# Patient Record
Sex: Male | Born: 1940 | Race: White | Hispanic: No | Marital: Married
Health system: Southern US, Community
[De-identification: ages and names within clinical notes are randomized; demographics above are authoritative.]

## PROBLEM LIST (undated history)

## (undated) DIAGNOSIS — R251 Tremor, unspecified: Secondary | ICD-10-CM

## (undated) DIAGNOSIS — L0390 Cellulitis, unspecified: Secondary | ICD-10-CM

## (undated) DIAGNOSIS — J449 Chronic obstructive pulmonary disease, unspecified: Secondary | ICD-10-CM

## (undated) DIAGNOSIS — F0390 Unspecified dementia without behavioral disturbance: Secondary | ICD-10-CM

## (undated) DIAGNOSIS — N400 Enlarged prostate without lower urinary tract symptoms: Secondary | ICD-10-CM

---

## 2015-04-01 ENCOUNTER — Encounter: Payer: Medicare Other | Attending: Surgery | Admitting: Surgery

## 2015-04-01 DIAGNOSIS — I890 Lymphedema, not elsewhere classified: Secondary | ICD-10-CM | POA: Insufficient documentation

## 2015-04-01 DIAGNOSIS — G309 Alzheimer's disease, unspecified: Secondary | ICD-10-CM | POA: Insufficient documentation

## 2015-04-01 DIAGNOSIS — Z880 Allergy status to penicillin: Secondary | ICD-10-CM | POA: Diagnosis not present

## 2015-04-01 DIAGNOSIS — Z87891 Personal history of nicotine dependence: Secondary | ICD-10-CM | POA: Diagnosis not present

## 2015-04-01 DIAGNOSIS — L97221 Non-pressure chronic ulcer of left calf limited to breakdown of skin: Secondary | ICD-10-CM | POA: Diagnosis present

## 2015-04-01 DIAGNOSIS — L97211 Non-pressure chronic ulcer of right calf limited to breakdown of skin: Secondary | ICD-10-CM | POA: Diagnosis not present

## 2015-04-02 NOTE — Progress Notes (Signed)
TYMAR, POLYAK ([id number]) Visit Report for 04/01/2015 Chief Complaint Document Details Patient Name: [name], [name] 04/01/2015 10:15 Date of Service: AM Medical Record [id number] Number: Patient Account Number: [id number] Date of Birth/Sex: Nov 28, 1940 (75 y.o. Male) Treating RN: Curtis Sites Primary Care Other Clinician: Physician: Treating Eshan Trupiano, Threasa Alpha, MARY Physician/Extender: Referring Physician: Derald Macleod in Treatment: 0 Information Obtained from: Caregiver Chief Complaint Compression wrap change due to swelling of both lower extremities for about a year Electronic Signature(s) Signed: 04/01/2015 12:01:01 PM By: Evlyn Kanner MD, FACS Entered By: Evlyn Kanner on 04/01/2015 12:01:00 TAKOTA, CAHALAN ([id number]) -------------------------------------------------------------------------------- HPI Details Patient Name: [name], [name] 04/01/2015 10:15 Date of Service: AM Medical Record [id number] Number: Patient Account Number: [id number] Date of Birth/Sex: 1940-12-22 (75 y.o. Male) Treating RN: Curtis Sites Primary Care Other Clinician: Physician: Treating Maxtyn Nuzum, Threasa Alpha, MARY Physician/Extender: Referring Physician: Derald Macleod in Treatment: 0 History of Present Illness Location: both lower extremities swollen and having some fluid draining Quality: Patient reports No Pain. Severity: Patient states wound are getting worse. Duration: Patient has had the wound for > 3 months prior to seeking treatment at the wound center Context: The wound appeared gradually over time Modifying Factors: Other treatment(s) tried include:he is at a couple of courses of antibiotics and some wraps applied to his legs Associated Signs and Symptoms: Patient reports having increase swelling. 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 He was generally  very healthy until about 4 years ago when the dementia set in and now has been at the [facility]. No formal workup has been done but they tried to wrap his legs which sometimes the patients takes down and has had a couple of courses of antibiotics. Not have a definite history of diabetes and was not a smoker Psychologist, prison and probation services) Signed: 04/01/2015 12:02:39 PM By: Evlyn Kanner MD, FACS Entered By: Evlyn Kanner on 04/01/2015 12:02:39 RAHKIM, RABALAIS ([id number]) -------------------------------------------------------------------------------- Physical Exam Details Patient Name: [name], [name] 04/01/2015 10:15 Date of Service: AM Medical Record [id number] Number: Patient Account Number: [id number] Date of Birth/Sex: Feb 04, 1941 (75 y.o. Male) Treating RN: Curtis Sites Primary Care Other Clinician: Physician: Treating Jakyiah Briones, Threasa Alpha, MARY Physician/Extender: Referring Physician: Derald Macleod in Treatment: 0 Constitutional . Pulse regular. Respirations normal and unlabored. Afebrile. . Eyes Nonicteric. Reactive to light. Ears, Nose, Mouth, and Throat Lips, teeth, and gums WNL.Marland Kitchen Moist mucosa without lesions. Neck supple and nontender. No palpable supraclavicular or cervical adenopathy. Normal sized without goiter. Respiratory WNL. No retractions.. Cardiovascular Pedal Pulses WNL. ABI on the left was 1.26 on the right was 1.35. No clubbing, cyanosis or edema. Gastrointestinal (GI) Abdomen without masses or tenderness.. No liver or spleen enlargement or tenderness.. Lymphatic No adneopathy. No adenopathy. No adenopathy. Musculoskeletal Adexa without tenderness or enlargement.. Digits and nails w/o clubbing, cyanosis, infection, petechiae, ischemia, or inflammatory conditions.. Integumentary (Hair, Skin) No suspicious lesions. No crepitus or fluctuance. No peri-wound warmth or erythema. No masses.Marland Kitchen Psychiatric Judgement and insight Intact.. No evidence  of depression, anxiety, or agitation.. Notes he has bilateral lymphedema and there are micro-ulcerations with some fluid draining out of this. You also have an element of fungal inflammation. Electronic Signature(s) Signed: 04/01/2015 12:03:32 PM By: Evlyn Kanner MD, FACS Entered By: Evlyn Kanner on 04/01/2015 12:03:31 EASTON, FETTY ([id number]) -------------------------------------------------------------------------------- Physician Orders Details Patient Name: TRANELL, WOJTKIEWICZ 04/01/2015 10:15 Date of Service: AM Medical Record [id number] Number: Patient Account Number: [id number] Date  of Birth/Sex: 12-Feb-1941 (75 y.o. Male) Treating RN: Curtis Sites Primary Care Other Clinician: Physician: Treating Kaipo Ardis, Threasa Alpha, MARY Physician/Extender: Referring Physician: Derald Macleod in Treatment: 0 Verbal / Phone Orders: Yes Clinician: Curtis Sites Read Back and Verified: Yes Diagnosis Coding Wound Cleansing Wound #1 Left,Lateral Lower Leg o Cleanse wound with mild soap and water Wound #2 Right,Lateral Lower Leg o Cleanse wound with mild soap and water Anesthetic Wound #1 Left,Lateral Lower Leg o Topical Lidocaine 4% cream applied to wound bed prior to debridement Wound #2 Right,Lateral Lower Leg o Topical Lidocaine 4% cream applied to wound bed prior to debridement Primary Wound Dressing Wound #1 Left,Lateral Lower Leg o Aquacel Ag o Other: - nystatin or lotrisone cream to red areas on legs Wound #2 Right,Lateral Lower Leg o Aquacel Ag o Other: - nystatin or lotrisone cream to red areas on legs Secondary Dressing Wound #1 Left,Lateral Lower Leg o ABD pad Wound #2 Right,Lateral Lower Leg o ABD pad Dressing Change Frequency Wound #1 Left,Lateral Lower Leg o Change Dressing Monday, Wednesday, Friday HOYT, LEANOS ([id number]) Wound #2 Right,Lateral Lower Leg o Change Dressing Monday, Wednesday, Friday Follow-up Appointments Wound #1  Left,Lateral Lower Leg o Return Appointment in 1 week. Wound #2 Right,Lateral Lower Leg o Return Appointment in 1 week. Edema Control Wound #1 Left,Lateral Lower Leg o 2 Layer Compression System applied bilaterally - kerlix and coban Wound #2 Right,Lateral Lower Leg o 2 Layer Compression System applied bilaterally - kerlix and coban Home Health Wound #1 Left,Lateral Lower Leg o Continue Home Health Visits o Home Health Nurse may visit PRN to address patientos wound care needs. o FACE TO FACE ENCOUNTER: MEDICARE and MEDICAID PATIENTS: I certify that this patient is under my care and that I had a face-to-face encounter that meets the physician face-to-face encounter requirements with this patient on this date. The encounter with the patient was in whole or in part for the following MEDICAL CONDITION: (primary reason for Home Healthcare) MEDICAL NECESSITY: I certify, that based on my findings, NURSING services are a medically necessary home health service. HOME BOUND STATUS: I certify that my clinical findings support that this patient is homebound (i.e., Due to illness or injury, pt requires aid of supportive devices such as crutches, cane, wheelchairs, walkers, the use of special transportation or the assistance of another person to leave their place of residence. There is a normal inability to leave the home and doing so requires considerable and taxing effort. Other absences are for medical reasons / religious services and are infrequent or of short duration when for other reasons). o If current dressing causes regression in wound condition, may D/C ordered dressing product/s and apply Normal Saline Moist Dressing daily until next Wound Healing Center / Other MD appointment. Notify Wound Healing Center of regression in wound condition at [phone]. o Please direct any NON-WOUND related issues/requests for orders to patient's Primary Care Physician Wound #2  Right,Lateral Lower Leg o Continue Home Health Visits o Home Health Nurse may visit PRN to address patientos wound care needs. o FACE TO FACE ENCOUNTER: MEDICARE and MEDICAID PATIENTS: I certify that this patient is under my care and that I had a face-to-face encounter that meets the physician face-to-face encounter requirements with this patient on this date. The encounter with the patient was in whole or in part for the following MEDICAL CONDITION: (primary reason for Home Healthcare) MEDICAL NECESSITY: I certify, that based on my findings, NURSING services are a medically ALDRED, MASE ([id number]) necessary home  health service. HOME BOUND STATUS: I certify that my clinical findings support that this patient is homebound (i.e., Due to illness or injury, pt requires aid of supportive devices such as crutches, cane, wheelchairs, walkers, the use of special transportation or the assistance of another person to leave their place of residence. There is a normal inability to leave the home and doing so requires considerable and taxing effort. Other absences are for medical reasons / religious services and are infrequent or of short duration when for other reasons). o If current dressing causes regression in wound condition, may D/C ordered dressing product/s and apply Normal Saline Moist Dressing daily until next Wound Healing Center / Other MD appointment. Notify Wound Healing Center of regression in wound condition at [phone]. o Please direct any NON-WOUND related issues/requests for orders to patient's Primary Care Physician Medications-please add to medication list. Wound #1 Left,Lateral Lower Leg o Other: - lortisone cream Wound #2 Right,Lateral Lower Leg o Other: - lortisone cream Electronic Signature(s) Signed: 04/01/2015 3:57:16 PM By: Evlyn Kanner MD, FACS Signed: 04/01/2015 5:06:47 PM By: Curtis Sites Entered By: Curtis Sites on 04/01/2015 11:57:26 [name] ([id number]) -------------------------------------------------------------------------------- Problem List Details Patient Name: [name], [name] 04/01/2015 10:15 Date of Service: AM Medical Record [id number] Number: Patient Account Number: [id number] Date of Birth/Sex: 1941/01/25 (75 y.o. Male) Treating RN: Curtis Sites Primary Care Other Clinician: Physician: Treating Dameer Speiser, Threasa Alpha, MARY Physician/Extender: Referring Physician: Derald Macleod in Treatment: 0 Active Problems ICD-10 Encounter Code Description Active Date Diagnosis I89.0 Lymphedema, not elsewhere classified 04/01/2015 Yes L97.221 Non-pressure chronic ulcer of left calf limited to 04/01/2015 Yes breakdown of skin L97.211 Non-pressure chronic ulcer of right calf limited to 04/01/2015 Yes breakdown of skin G30.9 Alzheimer's disease, unspecified 04/01/2015 Yes Inactive Problems Resolved Problems Electronic Signature(s) Signed: 04/01/2015 12:00:29 PM By: Evlyn Kanner MD, FACS Previous Signature: 04/01/2015 12:00:20 PM Version By: Evlyn Kanner MD, FACS Entered By: Evlyn Kanner on 04/01/2015 12:00:28 BOWEN, KIA ([id number]) -------------------------------------------------------------------------------- Progress Note Details Patient Name: OMID, DEARDORFF 04/01/2015 10:15 Date of Service: AM Medical Record [id number] Number: Patient Account Number: [id number] Date of Birth/Sex: May 29, 1940 (75 y.o. Male) Treating RN: Curtis Sites Primary Care Other Clinician: Physician: Treating Jamilynn Whitacre, Threasa Alpha, MARY Physician/Extender: Referring Physician: Derald Macleod in Treatment: 0 Subjective Chief Complaint Information obtained from Caregiver Compression wrap change due to swelling of both lower extremities for about a year History of Present Illness (HPI) The following HPI elements were documented for the patient's wound: Location: both lower extremities swollen and having some fluid draining Quality:  Patient reports No Pain. Severity: Patient states wound are getting worse. Duration: Patient has had the wound for > 3 months prior to seeking treatment at the wound center Context: The wound appeared gradually over time Modifying Factors: Other treatment(s) tried include:he is at a couple of courses of antibiotics and some wraps applied to his legs Associated Signs and Symptoms: Patient reports having increase swelling. 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 He was generally very healthy until about 4 years ago when the dementia set in and now has been at the [facility]. No formal workup has been done but they tried to wrap his legs which sometimes the patients takes down and has had a couple of courses of antibiotics. Not have a definite history of diabetes and was not a smoker Wound History Patient reportedly has not tested positive for  osteomyelitis. Patient reportedly has not had testing performed to evaluate circulation in the legs. Patient experiences the following problems associated with their wounds: swelling. Patient History Information obtained from Caregiver. Allergies penicillin TERRIUS, GENTILE ([id number]) Social History Former smoker - as a young adult, Marital Status - Married, Alcohol Use - Never, Drug Use - No History, Caffeine Use - Never. Medical History Eyes Patient has history of Cataracts - removed Neurologic Patient has history of Dementia - alzheimers Oncologic Denies history of Received Chemotherapy, Received Radiation Medical And Surgical History Notes Neurologic tremors Psychiatric psychosis Review of Systems (ROS) Constitutional Symptoms (General Health) The patient has no complaints or symptoms. Eyes The patient has no complaints or symptoms. Ear/Nose/Mouth/Throat The patient has no complaints or  symptoms. Hematologic/Lymphatic The patient has no complaints or symptoms. Respiratory The patient has no complaints or symptoms. Cardiovascular Complains or has symptoms of LE edema. Gastrointestinal The patient has no complaints or symptoms. Endocrine The patient has no complaints or symptoms. Genitourinary Complains or has symptoms of Incontinence/dribbling. Immunological The patient has no complaints or symptoms. Integumentary (Skin) The patient has no complaints or symptoms. Musculoskeletal The patient has no complaints or symptoms. Oncologic The patient has no complaints or symptoms. Medications JEFTE, CARITHERS ([id number]) aspirin 81 mg chewable tablet oral 1 1 tablet,chewable oral carbamazepine 200 mg tablet oral tablet oral primidone 50 mg tablet oral 1 1 tablet oral loperamide 2 mg capsule oral 2 2 capsule oral risperidone 2 mg tablet oral 1 1 tablet oral Flovent HFA 110 mcg/actuation aerosol inhaler inhalation 1 1 HFA aerosol inhaler inhalation polyethylene glycol 3350 17 gram oral powder packet oral 1 1 powder in packet oral furosemide 20 mg tablet oral 1 1 tablet oral multivitamin tablet oral 1 1 tablet oral fluticasone 50 mcg/actuation nasal spray,suspension nasal 1 1 spray,suspension nasal potassium chloride ER 10 mEq tablet,extended release(part/cryst) oral 1 1 tablet,ER particles/crystals oral zolpidem 5 mg tablet oral 1 1 tablet oral paroxetine 20 mg tablet oral 1 1 tablet oral Vitamin D3 1,000 unit tablet oral 1 1 tablet oral Objective Constitutional Pulse regular. Respirations normal and unlabored. Afebrile. Vitals Time Taken: 10:59 AM, Height: 69 in, Source: Stated, Weight: 203 lbs, Source: Stated, BMI: 30, Temperature: 97.7 F, Pulse: 64 bpm, Respiratory Rate: 16 breaths/min, Blood Pressure: 134/72 mmHg. Eyes Nonicteric. Reactive to light. Ears, Nose, Mouth, and Throat Lips, teeth, and gums WNL.Marland Kitchen Moist mucosa without lesions. Neck supple and  nontender. No palpable supraclavicular or cervical adenopathy. Normal sized without goiter. Respiratory WNL. No retractions.. Cardiovascular Pedal Pulses WNL. ABI on the left was 1.26 on the right was 1.35. No clubbing, cyanosis or edema. Gastrointestinal (GI) Abdomen without masses or tenderness.. No liver or spleen enlargement or tenderness.. Lymphatic No adneopathy. No adenopathy. No adenopathy. Musculoskeletal AARSH, FRISTOE ([id number]) Adexa without tenderness or enlargement.. Digits and nails w/o clubbing, cyanosis, infection, petechiae, ischemia, or inflammatory conditions.Marland Kitchen Psychiatric Judgement and insight Intact.. No evidence of depression, anxiety, or agitation.. General Notes: he has bilateral lymphedema and there are micro-ulcerations with some fluid draining out of this. You also have an element of fungal inflammation. Integumentary (Hair, Skin) No suspicious lesions. No crepitus or fluctuance. No peri-wound warmth or erythema. No masses.. Wound #1 status is Open. Original cause of wound was Gradually Appeared. The wound is located on the Left,Lateral Lower Leg. The wound measures 2.2cm length x 0.8cm width x 0.1cm depth; 1.382cm^2 area and 0.138cm^3 volume. The wound is limited to skin breakdown. There is no tunneling or undermining noted. There is a medium  amount of serous drainage noted. The wound margin is flat and intact. There is large (67-100%) pink granulation within the wound bed. There is no necrotic tissue within the wound bed. The periwound skin appearance did not exhibit: Callus, Crepitus, Excoriation, Fluctuance, Friable, Induration, Localized Edema, Rash, Scarring, Dry/Scaly, Maceration, Moist, Atrophie Blanche, Cyanosis, Ecchymosis, Hemosiderin Staining, Mottled, Pallor, Rubor, Erythema. Periwound temperature was noted as No Abnormality. Wound #2 status is Open. Original cause of wound was Gradually Appeared. The wound is located on the Right,Lateral Lower  Leg. The wound measures 4cm length x 5cm width x 0.1cm depth; 15.708cm^2 area and 1.571cm^3 volume. The wound is limited to skin breakdown. There is no tunneling or undermining noted. There is a large amount of serous drainage noted. The wound margin is flat and intact. There is large (67-100%) pink granulation within the wound bed. There is no necrotic tissue within the wound bed. The periwound skin appearance did not exhibit: Callus, Crepitus, Excoriation, Fluctuance, Friable, Induration, Localized Edema, Rash, Scarring, Dry/Scaly, Maceration, Moist, Atrophie Blanche, Cyanosis, Ecchymosis, Hemosiderin Staining, Mottled, Pallor, Rubor, Erythema. Periwound temperature was noted as No Abnormality. 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 I agree with her and would recommend: 1. No formal workup of his arterial and venous systems. 2. Aquacel Ag over the ulcerations which are small, antifungal ointment over the skin and a light Kerlix and Coban wrap to start with. This can be changed by his nurses 3 times a week. 3. the him at regular intervals at the wound center and make appropriate changes to his compression. 4. once the heel put him in compression stockings appropriately. The wife has had all questions answered and will be back with them next week Plan Wound Cleansing: Wound #1 Left,Lateral Lower Leg: Cleanse wound with  mild soap and water Wound #2 Right,Lateral Lower Leg: Cleanse wound with mild soap and water Anesthetic: Wound #1 Left,Lateral Lower Leg: Topical Lidocaine 4% cream applied to wound bed prior to debridement Wound #2 Right,Lateral Lower Leg: Topical Lidocaine 4% cream applied to wound bed prior to debridement Primary Wound Dressing: Wound #1 Left,Lateral Lower Leg: Aquacel Ag Other: - nystatin or lotrisone cream to red areas on legs Wound #2 Right,Lateral Lower Leg: Aquacel Ag Other: - nystatin or lotrisone cream to red areas on legs Secondary Dressing: Wound #1 Left,Lateral Lower Leg: ABD pad Wound #2 Right,Lateral Lower Leg: ABD pad Dressing Change Frequency: Wound #1 Left,Lateral Lower Leg: Change Dressing Monday, Wednesday, Friday Wound #2 Right,Lateral Lower Leg: Change Dressing Monday, Wednesday, Friday Follow-up Appointments: Wound #1 Left,Lateral Lower Leg: Return Appointment in 1 week. Wound #2 Right,Lateral Lower Leg: Return Appointment in 1 week. Edema Control: OLANDO, WILLEMS ([id number]) Wound #1 Left,Lateral Lower Leg: 2 Layer Compression System applied bilaterally - kerlix and coban Wound #2 Right,Lateral Lower Leg: 2 Layer Compression System applied bilaterally - kerlix and coban Home Health: Wound #1 Left,Lateral Lower Leg: Continue Home Health Visits Home Health Nurse may visit PRN to address patient s wound care needs. FACE  TO FACE ENCOUNTER: MEDICARE and MEDICAID PATIENTS: I certify that this patient is under my care and that I had a face-to-face encounter that meets the physician face-to-face encounter requirements with this patient on this date. The encounter with the patient was in whole or in part for the following MEDICAL CONDITION: (primary reason for Home Healthcare) MEDICAL NECESSITY: I certify, that based on my findings, NURSING services are a medically necessary home health service. HOME BOUND STATUS: I certify that my clinical findings support  that this patient is homebound (i.e., Due to illness or injury, pt requires aid of supportive devices such as crutches, cane, wheelchairs, walkers, the use of special transportation or the assistance of another person to leave their place of residence. There is a normal inability to leave the home and doing so requires considerable and taxing effort. Other absences are for medical reasons / religious services and are infrequent or of short duration when for other reasons). If current dressing causes regression in wound condition, may D/C ordered dressing product/s and apply Normal Saline Moist Dressing daily until next Wound Healing Center / Other MD appointment. Notify Wound Healing Center of regression in wound condition at [phone]. Please direct any NON-WOUND related issues/requests for orders to patient's Primary Care Physician Wound #2 Right,Lateral Lower Leg: Continue Home Health Visits Home Health Nurse may visit PRN to address patient s wound care needs. FACE TO FACE ENCOUNTER: MEDICARE and MEDICAID PATIENTS: I certify that this patient is under my care and that I had a face-to-face encounter that meets the physician face-to-face encounter requirements with this patient on this date. The encounter with the patient was in whole or in part for the following MEDICAL CONDITION: (primary reason for Home Healthcare) MEDICAL NECESSITY: I certify, that based on my findings, NURSING services are a medically necessary home health service. HOME BOUND STATUS: I certify that my clinical findings support that this patient is homebound (i.e., Due to illness or injury, pt requires aid of supportive devices such as crutches, cane, wheelchairs, walkers, the use of special transportation or the assistance of another person to leave their place of residence. There is a normal inability to leave the home and doing so requires considerable and taxing effort. Other absences are for medical reasons /  religious services and are infrequent or of short duration when for other reasons). If current dressing causes regression in wound condition, may D/C ordered dressing product/s and apply Normal Saline Moist Dressing daily until next Wound Healing Center / Other MD appointment. Notify Wound Healing Center of regression in wound condition at [phone]. Please direct any NON-WOUND related issues/requests for orders to patient's Primary Care Physician Medications-please add to medication list.: Wound #1 Left,Lateral Lower Leg: Other: - lortisone cream Wound #2 Right,Lateral Lower Leg: Other: - lortisone cream [name], [name] ([id number]) This 75 year old gentleman with significant dementia is seen with bilateral lower extremity lymphedema stage II which as per his wife's request will be treated symptomatically and she does not want to much of investigations done due to the inability of him to be transported and putting him through discomfort. I agree with her and would recommend: 1. No formal workup of his arterial and venous systems. 2. Aquacel Ag over the ulcerations which are small, antifungal ointment over the skin and a light Kerlix and Coban wrap to start with. This can be changed by his nurses 3 times a week. 3. the him at regular intervals at the wound center and make appropriate changes to his compression. 4.  once the heel put him in compression stockings appropriately. The wife has had all questions answered and will be back with them next week Electronic Signature(s) Signed: 04/01/2015 12:08:34 PM By: Evlyn Kanner MD, FACS Entered By: Evlyn Kanner on 04/01/2015 12:08:34 [name] ([id number]) -------------------------------------------------------------------------------- ROS/PFSH Details Patient Name: [name], [name] 04/01/2015 10:15 Date of Service: AM Medical Record [id number] Number: Patient Account Number: [id number] Date of Birth/Sex: May 03, 1940 (75 y.o. Male) Treating RN:  Curtis Sites Primary Care Other Clinician: Physician: Treating Ayub Kirsh, Threasa Alpha, MARY Physician/Extender: Referring Physician: Derald Macleod in Treatment: 0 Information Obtained From Caregiver Wound History Do you currently have one or more open woundso Yes Approximately how long have you had your woundso 3 months How have you been treating your wound(s) until nowo silver alginate and compression hose Has your wound(s) ever healed and then re-openedo No Have you had any lab work done in the past montho No Have you tested positive for an antibiotic resistant organism No (MRSA, VRE)o Have you tested positive for osteomyelitis (bone infection)o No Have you had any tests for circulation on your legso No Have you had other problems associated with your woundso Swelling Cardiovascular Complaints and Symptoms: Positive for: LE edema Genitourinary Complaints and Symptoms: Positive for: Incontinence/dribbling Constitutional Symptoms (General Health) Complaints and Symptoms: No Complaints or Symptoms Eyes Complaints and Symptoms: No Complaints or Symptoms Medical History: Positive for: Cataracts - removed Ear/Nose/Mouth/Throat CARRIE, SCHOONMAKER ([id number]) Complaints and Symptoms: No Complaints or Symptoms Hematologic/Lymphatic Complaints and Symptoms: No Complaints or Symptoms Respiratory Complaints and Symptoms: No Complaints or Symptoms Gastrointestinal Complaints and Symptoms: No Complaints or Symptoms Endocrine Complaints and Symptoms: No Complaints or Symptoms Immunological Complaints and Symptoms: No Complaints or Symptoms Integumentary (Skin) Complaints and Symptoms: No Complaints or Symptoms Musculoskeletal Complaints and Symptoms: No Complaints or Symptoms Neurologic Medical History: Positive for: Dementia - alzheimers Past Medical History Notes: tremors Oncologic Complaints and Symptoms: No Complaints or Symptoms Medical History: Negative for:  Received Chemotherapy; Received Radiation [name], [name] ([id number]) Psychiatric Medical History: Past Medical History Notes: psychosis HBO Extended History Items Eyes: Cataracts Immunizations Immunization Notes: up to date Family and Social History Former smoker - as a young adult; Marital Status - Married; Alcohol Use: Never; Drug Use: No History; Caffeine Use: Never; Financial Concerns: No; Food, Clothing or Shelter Needs: No; Support System Lacking: No; Transportation Concerns: No; Advanced Directives: Yes - spouse is guardian (Not Provided); Patient does not want information on Advanced Directives; Medical Power of Attorney: No Physician Affirmation I have reviewed and agree with the above information. Electronic Signature(s) Signed: 04/01/2015 11:46:28 AM By: Evlyn Kanner MD, FACS Signed: 04/01/2015 5:06:47 PM By: Curtis Sites Entered By: Evlyn Kanner on 04/01/2015 11:46:28 [name], [name] ([id number]) -------------------------------------------------------------------------------- SuperBill Details Patient Name: [name] Date of Service: 04/01/2015 Medical Record Patient Account Number: [id number] Number: Dorthy, Treating RN: Date of Birth/Sex: Mar 09, 1940 (75 y.o. 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 G30.9 Alzheimer's disease, unspecified Facility Procedures CPT4 Code: [id number] Description: 25366 - WOUND CARE VISIT-LEV 5 EST PT Modifier: Quantity: 1 Physician Procedures CPT4 Code Description: 4403474 25956 - WC PHYS LEVEL 4 - NEW PT ICD-10 Description Diagnosis I89.0 Lymphedema, not  elsewhere classified L97.221 Non-pressure chronic ulcer of left calf limited to  b L97.211 Non-pressure chronic ulcer of right calf limited  to G30.9 Alzheimer's disease, unspecified Modifier: reakdown of skin breakdown of ski Quantity: 1 n Electronic Signature(s) Signed: 04/01/2015 4:42:57 PM By: Curtis Sites Previous Signature: 04/01/2015 12:08:52 PM Version By: Evlyn Kanner MD, FACS Entered By: Curtis Sites on 04/01/2015 16:42:57

## 2015-04-02 NOTE — Progress Notes (Signed)
GAL, FELDHAUS ([id number]) Visit Report for 04/01/2015 Allergy List Details Patient Name: [name], [name] 04/01/2015 10:15 Date of Service: AM Medical Record [id number] Number: Patient Account Number: [id number] Date of Birth/Sex: 1941/01/22 (75 y.o. Male) Treating RN: Curtis Sites Primary Care Other Clinician: Physician: Treating Britto, Threasa Alpha, MARY Physician/Extender: Referring Physician: Derald Macleod in Treatment: 0 Allergies Active Allergies penicillin Allergy Notes Electronic Signature(s) Signed: 04/01/2015 5:06:47 PM By: Curtis Sites Entered By: Curtis Sites on 04/01/2015 11:03:00 [name] ([id number]) -------------------------------------------------------------------------------- Arrival Information Details Patient Name: [name] 04/01/2015 10:15 Date of Service: AM Medical Record [id number] Number: Patient Account Number: [id number] Date of Birth/Sex: 11-23-1940 (75 y.o. Male) Treating RN: Curtis Sites Primary Care Other Clinician: Physician: Treating Britto, Threasa Alpha, MARY Physician/Extender: Referring Physician: Derald Macleod in Treatment: 0 Visit Information Patient Arrived: Ambulatory Arrival Time: 10:55 Accompanied By: wife Transfer Assistance: None Patient Identification Verified: Yes Secondary Verification Process Yes Completed: Electronic Signature(s) Signed: 04/01/2015 5:06:47 PM By: Curtis Sites Entered By: Curtis Sites on 04/01/2015 10:59:25 [name] ([id number]) -------------------------------------------------------------------------------- Clinic Level of Care Assessment Details Patient Name: [name] 04/01/2015 10:15 Date of Service: AM Medical Record [id number] Number: Patient Account Number: [id number] Date of Birth/Sex: 03-Aug-1940 (75 y.o. Male) Treating RN: Curtis Sites Primary Care Other Clinician: Physician: Treating Britto, Threasa Alpha, MARY Physician/Extender: Referring  Physician: Derald Macleod in Treatment: 0 Clinic Level of Care Assessment Items TOOL 2 Quantity Score []  - Use when only an EandM is performed on the INITIAL visit 0 ASSESSMENTS - Nursing Assessment / Reassessment X - General Physical Exam (combine w/ comprehensive assessment (listed just 1 20 below) when performed on new pt. evals) X - Comprehensive Assessment (HX, ROS, Risk Assessments, Wounds Hx, etc.) 1 25 ASSESSMENTS - Wound and Skin Assessment / Reassessment []  - Simple Wound Assessment / Reassessment - one wound 0 X - Complex Wound Assessment / Reassessment - multiple wounds 2 5 []  - Dermatologic / Skin Assessment (not related to wound area) 0 ASSESSMENTS - Ostomy and/or Continence Assessment and Care []  - Incontinence Assessment and Management 0 []  - Ostomy Care Assessment and Management (repouching, etc.) 0 PROCESS - Coordination of Care X - Simple Patient / Family Education for ongoing care 1 15 []  - Complex (extensive) Patient / Family Education for ongoing care 0 X - Staff obtains Chiropractor, Records, Test Results / Process Orders 1 10 []  - Staff telephones HHA, Nursing Homes / Clarify orders / etc 0 []  - Routine Transfer to another Facility (non-emergent condition) 0 []  - Routine Hospital Admission (non-emergent condition) 0 X - New Admissions / Manufacturing engineer / Ordering NPWT, Apligraf, etc. 1 15 [name], [name] ([id number]) []  - Emergency Hospital Admission (emergent condition) 0 X - Simple Discharge Coordination 1 10 []  - Complex (extensive) Discharge Coordination 0 PROCESS - Special Needs []  - Pediatric / Minor Patient Management 0 []  - Isolation Patient Management 0 []  - Hearing / Language / Visual special needs 0 []  - Assessment of Community assistance (transportation, D/C planning, etc.) 0 []  - Additional assistance / Altered mentation 0 []  - Support Surface(s) Assessment (bed, cushion, seat, etc.) 0 INTERVENTIONS - Wound Cleansing / Measurement X - Wound  Imaging (photographs - any number of wounds) 1 5 []  - Wound Tracing (instead of photographs) 0 []  - Simple Wound Measurement - one wound 0 X - Complex Wound Measurement - multiple wounds 2 5 []  - Simple Wound Cleansing - one wound 0 X - Complex Wound Cleansing - multiple wounds 2 5 INTERVENTIONS - Wound Dressings []  -  Small Wound Dressing one or multiple wounds 0 []  - Medium Wound Dressing one or multiple wounds 0 X - Large Wound Dressing one or multiple wounds 2 20 []  - Application of Medications - injection 0 INTERVENTIONS - Miscellaneous []  - External ear exam 0 []  - Specimen Collection (cultures, biopsies, blood, body fluids, etc.) 0 []  - Specimen(s) / Culture(s) sent or taken to Lab for analysis 0 []  - Patient Transfer (multiple staff / Michiel Sites Lift / Similar devices) 0 TRUE, GARCIAMARTINEZ ([id number]) []  - Simple Staple / Suture removal (25 or less) 0 []  - Complex Staple / Suture removal (26 or more) 0 []  - Hypo / Hyperglycemic Management (close monitor of Blood Glucose) 0 X - Ankle / Brachial Index (ABI) - do not check if billed separately 1 15 Has the patient been seen at the hospital within the last three years: Yes Total Score: 185 Level Of Care: New/Established - Level 5 Electronic Signature(s) Signed: 04/01/2015 4:42:47 PM By: Curtis Sites Entered By: Curtis Sites on 04/01/2015 16:42:46 [name] ([id number]) -------------------------------------------------------------------------------- Encounter Discharge Information Details Patient Name: [name] 04/01/2015 10:15 Date of Service: AM Medical Record [id number] Number: Patient Account Number: [id number] Date of Birth/Sex: Dec 05, 1940 (75 y.o. Male) Treating RN: Curtis Sites Primary Care Other Clinician: Physician: Treating Britto, Threasa Alpha, MARY Physician/Extender: Referring Physician: Derald Macleod in Treatment: 0 Encounter Discharge Information Items Discharge Pain Level: 0 Discharge Condition:  Stable Ambulatory Status: Ambulatory Discharge Destination: Home Transportation: Private Auto Accompanied By: spouse Schedule Follow-up Appointment: Yes Medication Reconciliation completed and provided to Patient/Care No Chanson Teems: Provided on Clinical Summary of Care: 04/01/2015 Form Type Recipient Paper Patient JM Electronic Signature(s) Signed: 04/01/2015 4:43:56 PM By: Curtis Sites Previous Signature: 04/01/2015 12:16:09 PM Version By: Gwenlyn Perking Entered By: Curtis Sites on 04/01/2015 16:43:56 [name] ([id number]) -------------------------------------------------------------------------------- Lower Extremity Assessment Details Patient Name: ISSAM, CARLYON 04/01/2015 10:15 Date of Service: AM Medical Record [id number] Number: Patient Account Number: [id number] Date of Birth/Sex: 1940/12/15 (75 y.o. Male) Treating RN: Curtis Sites Primary Care Other Clinician: Physician: Treating Britto, Threasa Alpha, MARY Physician/Extender: Referring Physician: Derald Macleod in Treatment: 0 Edema Assessment Assessed: [Left: No] [Right: No] Edema: [Left: Yes] [Right: Yes] Calf Left: Right: Point of Measurement: 36 cm From Medial Instep 41.6 cm 40.3 cm Ankle Left: Right: Point of Measurement: 12 cm From Medial Instep 24.4 cm 24.6 cm Vascular Assessment Pulses: Posterior Tibial Palpable: [Left:No] [Right:No] Doppler: [Left:Monophasic] [Right:Monophasic] Dorsalis Pedis Palpable: [Left:Yes] [Right:Yes] Doppler: [Left:Monophasic] [Right:Monophasic] Extremity colors, hair growth, and conditions: Extremity Color: [Left:Red] [Right:Red] Hair Growth on Extremity: [Left:Yes] [Right:Yes] Temperature of Extremity: [Left:Warm] [Right:Warm] Capillary Refill: [Left:< 3 seconds] [Right:< 3 seconds] Blood Pressure: Brachial: [Left:132] Dorsalis Pedis: 152 [Left:Dorsalis Pedis: 158] Ankle: Posterior Tibial: 166 [Left:Posterior Tibial: 178 1.26] [Right:1.35] Toe Nail  Assessment Left: Right: Thick: Yes Yes MYKELL, RAWL ([id number]) Discolored: Yes Yes Deformed: No No Improper Length and Hygiene: No No Electronic Signature(s) Signed: 04/01/2015 5:06:47 PM By: Curtis Sites Entered By: Curtis Sites on 04/01/2015 11:50:20 [name] ([id number]) -------------------------------------------------------------------------------- Multi Wound Chart Details Patient Name: SKYLUR, FUSTON 04/01/2015 10:15 Date of Service: AM Medical Record [id number] Number: Patient Account Number: [id number] Date of Birth/Sex: Jul 18, 1940 (75 y.o. Male) Treating RN: Curtis Sites Primary Care Other Clinician: Physician: Treating Britto, Threasa Alpha, MARY Physician/Extender: Referring Physician: Derald Macleod in Treatment: 0 Vital Signs Height(in): 69 Pulse(bpm): 64 Weight(lbs): 203 Blood Pressure 134/72 (mmHg): Body Mass Index(BMI): 30 Temperature(F): 97.7 Respiratory Rate 16 (breaths/min): Photos: [1:No Photos] [2:No Photos] [N/A:N/A] Wound Location: [1:Left Lower Leg - Lateral] [  2:Right Lower Leg - Lateral] [N/A:N/A] Wounding Event: [1:Gradually Appeared] [2:Gradually Appeared] [N/A:N/A] Primary Etiology: [1:Venous Leg Ulcer] [2:Venous Leg Ulcer] [N/A:N/A] Comorbid History: [1:Cataracts, Dementia] [2:Cataracts, Dementia] [N/A:N/A] Date Acquired: [1:12/27/2014] [2:12/27/2014] [N/A:N/A] Weeks of Treatment: [1:0] [2:0] [N/A:N/A] Wound Status: [1:Open] [2:Open] [N/A:N/A] Clustered Wound: [1:Yes] [2:No] [N/A:N/A] Measurements L x W x D 2.2x0.8x0.1 [2:4x5x0.1] [N/A:N/A] (cm) Area (cm) : [1:1.382] [2:15.708] [N/A:N/A] Volume (cm) : [1:0.138] [2:1.571] [N/A:N/A] Classification: [1:Partial Thickness] [2:Partial Thickness] [N/A:N/A] Exudate Amount: [1:Medium] [2:Large] [N/A:N/A] Exudate Type: [1:Serous] [2:Serous] [N/A:N/A] Exudate Color: [1:amber] [2:amber] [N/A:N/A] Wound Margin: [1:Flat and Intact] [2:Flat and Intact] [N/A:N/A] Granulation Amount:  [1:Large (67-100%)] [2:Large (67-100%)] [N/A:N/A] Granulation Quality: [1:Pink] [2:Pink] [N/A:N/A] Necrotic Amount: [1:None Present (0%)] [2:None Present (0%)] [N/A:N/A] Exposed Structures: [1:Fascia: No Fat: No Tendon: No Muscle: No Joint: No Bone: No] [2:Fascia: No Fat: No Tendon: No Muscle: No Joint: No Bone: No] [N/A:N/A] Limited to Skin Limited to Skin Breakdown Breakdown Epithelialization: None None N/A Periwound Skin Texture: Edema: No Edema: No N/A Excoriation: No Excoriation: No Induration: No Induration: No Callus: No Callus: No Crepitus: No Crepitus: No Fluctuance: No Fluctuance: No Friable: No Friable: No Rash: No Rash: No Scarring: No Scarring: No Periwound Skin Maceration: No Maceration: No N/A Moisture: Moist: No Moist: No Dry/Scaly: No Dry/Scaly: No Periwound Skin Color: Atrophie Blanche: No Atrophie Blanche: No N/A Cyanosis: No Cyanosis: No Ecchymosis: No Ecchymosis: No Erythema: No Erythema: No Hemosiderin Staining: No Hemosiderin Staining: No Mottled: No Mottled: No Pallor: No Pallor: No Rubor: No Rubor: No Temperature: No Abnormality No Abnormality N/A Tenderness on No No N/A Palpation: Wound Preparation: Ulcer Cleansing: Ulcer Cleansing: N/A Rinsed/Irrigated with Rinsed/Irrigated with Saline Saline Topical Anesthetic Topical Anesthetic Applied: None Applied: None Treatment Notes Electronic Signature(s) Signed: 04/01/2015 5:06:47 PM By: Curtis Sites Entered By: Curtis Sites on 04/01/2015 11:51:25 [name] ([id number]) -------------------------------------------------------------------------------- Multi-Disciplinary Care Plan Details Patient Name: NIVIN, BRANIFF 04/01/2015 10:15 Date of Service: AM Medical Record [id number] Number: Patient Account Number: [id number] Date of Birth/Sex: 1940-07-29 (75 y.o. Male) Treating RN: Curtis Sites Primary Care Other Clinician: Physician: Treating Britto, Threasa Alpha, MARY  Physician/Extender: Referring Physician: Derald Macleod in Treatment: 0 Active Inactive Abuse / Safety / Falls / Self Care Management Nursing Diagnoses: Impaired physical mobility Potential for falls Goals: Patient will remain injury free Date Initiated: 04/01/2015 Goal Status: Active Interventions: Assess fall risk on admission and as needed Notes: Orientation to the Wound Care Program Nursing Diagnoses: Knowledge deficit related to the wound healing center program Goals: Patient/caregiver will verbalize understanding of the Wound Healing Center Program Date Initiated: 04/01/2015 Goal Status: Active Interventions: Provide education on orientation to the wound center Notes: Venous Leg Ulcer Nursing Diagnoses: Potential for venous Insuffiency (use before diagnosis confirmed) MALONE, VANBLARCOM (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 Notes: Wound/Skin Impairment Nursing Diagnoses: Impaired tissue integrity Goals: Patient/caregiver will verbalize understanding of skin care regimen Date Initiated: 04/01/2015 Goal Status: Active Ulcer/skin breakdown will have a volume reduction of 30% by week 4 Date Initiated: 04/01/2015 Goal Status: Active Ulcer/skin breakdown will have a volume reduction of 50% by week 8 Date Initiated: 04/01/2015 Goal Status: Active Ulcer/skin breakdown will have a volume reduction of 80% by week 12 Date Initiated: 04/01/2015 Goal Status: Active Ulcer/skin breakdown will heal within 14 weeks Date Initiated: 04/01/2015 Goal Status: Active Interventions: Assess ulceration(s) every visit Notes: Electronic Signature(s) Signed: 04/01/2015 5:06:47 PM By: Curtis Sites Entered By: Curtis Sites on 04/01/2015 11:51:13 Dunkley,  [place]  ([id number]) -------------------------------------------------------------------------------- Patient/Caregiver Education Details Patient Name: NATNAEL, BIEDERMAN 04/01/2015 10:15 Date of Service: AM Medical Record [id number] Number: Patient Account Number: [id number] Date of Birth/Gender: 22-Sep-1940 (75 y.o. Male) Treating RN: Curtis Sites Primary Care Other Clinician: Physician: Treating Britto, Threasa Alpha, MARY Physician/Extender: Referring Physician: Derald Macleod in Treatment: 0 Education Assessment Education Provided To: Caregiver Education Topics Provided Venous: Handouts: Controlling Swelling with Multilayered Compression Wraps Methods: Explain/Verbal, Printed Responses: State content correctly Electronic Signature(s) Signed: 04/01/2015 4:44:10 PM By: Curtis Sites Entered By: Curtis Sites on 04/01/2015 16:44:09 [name] ([id number]) -------------------------------------------------------------------------------- Wound Assessment Details Patient Name: VERNAL, HRITZ 04/01/2015 10:15 Date of Service: AM Medical Record [id number] Number: Patient Account Number: [id number] Date of Birth/Sex: 10-09-1940 (75 y.o. Male) Treating RN: Curtis Sites Primary Care Other Clinician: Physician: Treating Britto, Threasa Alpha, MARY Physician/Extender: Referring Physician: Derald Macleod in Treatment: 0 Wound Status Wound Number: 1 Primary Etiology: Venous Leg Ulcer Wound Location: Left Lower Leg - Lateral Wound Status: Open Wounding Event: Gradually Appeared Comorbid History: Cataracts, Dementia Date Acquired: 12/27/2014 Weeks Of Treatment: 0 Clustered Wound: Yes Photos Photo Uploaded By: Curtis Sites on 04/01/2015 16:48:01 Wound Measurements Length: (cm) 2.2 Width: (cm) 0.8 Depth: (cm) 0.1 Area: (cm) 1.382 Volume: (cm) 0.138 % Reduction in Area: % Reduction in Volume: Epithelialization: None Tunneling: No Undermining: No Wound  Description Classification: Partial Thickness Foul Odor After Wound Margin: Flat and Intact Exudate Amount: Medium Exudate Type: Serous Exudate Color: amber Cleansing: No Wound Bed Granulation Amount: Large (67-100%) Exposed Structure Granulation Quality: Pink Fascia Exposed: No CASTEN, FLOREN ([id number]) Necrotic Amount: None Present (0%) Fat Layer Exposed: No Tendon Exposed: No Muscle Exposed: No Joint Exposed: No Bone Exposed: No Limited to Skin Breakdown Periwound Skin Texture Texture Color No Abnormalities Noted: No No Abnormalities Noted: No Callus: No Atrophie Blanche: No Crepitus: No Cyanosis: No Excoriation: No Ecchymosis: No Fluctuance: No Erythema: No Friable: No Hemosiderin Staining: No Induration: No Mottled: No Localized Edema: No Pallor: No Rash: No Rubor: No Scarring: No Temperature / Pain Moisture Temperature: No Abnormality No Abnormalities Noted: No Dry / Scaly: No Maceration: No Moist: No Wound Preparation Ulcer Cleansing: Rinsed/Irrigated with Saline Topical Anesthetic Applied: None Treatment Notes Wound #1 (Left, Lateral Lower Leg) 1. Cleansed with: Clean wound with Normal Saline 3. Peri-wound Care: Antifungal cream 4. Dressing Applied: Aquacel Ag 5. Secondary Dressing Applied ABD Pad 7. Secured with 2 Layer Lite Compression System - Bilateral Notes kerlix and coban from toes to 3cm below the knee Electronic Signature(s) Signed: 04/01/2015 5:06:47 PM By: Ronda Fairly, Jonny Ruiz ([id number]) Entered By: Curtis Sites on 04/01/2015 11:17:45 [name] ([id number]) -------------------------------------------------------------------------------- Wound Assessment Details Patient Name: JOAH, PATLAN 04/01/2015 10:15 Date of Service: AM Medical Record [id number] Number: Patient Account Number: [id number] Date of Birth/Sex: 01-Aug-1940 (75 y.o. Male) Treating RN: Curtis Sites Primary Care Other Clinician: Physician:  Treating Britto, Threasa Alpha, MARY Physician/Extender: Referring Physician: Derald Macleod in Treatment: 0 Wound Status Wound Number: 2 Primary Etiology: Venous Leg Ulcer Wound Location: Right Lower Leg - Lateral Wound Status: Open Wounding Event: Gradually Appeared Comorbid History: Cataracts, Dementia Date Acquired: 12/27/2014 Weeks Of Treatment: 0 Clustered Wound: No Photos Photo Uploaded By: Curtis Sites on 04/01/2015 16:48:02 Wound Measurements Length: (cm) 4 Width: (cm) 5 Depth: (cm) 0.1 Area: (cm) 15.708 Volume: (cm) 1.571 % Reduction in Area: % Reduction in Volume: Epithelialization: None Tunneling: No Undermining: No Wound Description Classification: Partial Thickness Foul Odor After Wound Margin: Flat and Intact Exudate Amount: Large Exudate Type: Serous Exudate Color: amber  Cleansing: No Wound Bed Granulation Amount: Large (67-100%) Exposed Structure Granulation Quality: Pink Fascia Exposed: No BELEN, ZWAHLEN ([id number]) Necrotic Amount: None Present (0%) Fat Layer Exposed: No Tendon Exposed: No Muscle Exposed: No Joint Exposed: No Bone Exposed: No Limited to Skin Breakdown Periwound Skin Texture Texture Color No Abnormalities Noted: No No Abnormalities Noted: No Callus: No Atrophie Blanche: No Crepitus: No Cyanosis: No Excoriation: No Ecchymosis: No Fluctuance: No Erythema: No Friable: No Hemosiderin Staining: No Induration: No Mottled: No Localized Edema: No Pallor: No Rash: No Rubor: No Scarring: No Temperature / Pain Moisture Temperature: No Abnormality No Abnormalities Noted: No Dry / Scaly: No Maceration: No Moist: No Wound Preparation Ulcer Cleansing: Rinsed/Irrigated with Saline Topical Anesthetic Applied: None Treatment Notes Wound #2 (Right, Lateral Lower Leg) 1. Cleansed with: Clean wound with Normal Saline 3. Peri-wound Care: Antifungal cream 4. Dressing Applied: Aquacel Ag 5. Secondary Dressing  Applied ABD Pad 7. Secured with 2 Layer Lite Compression System - Bilateral Notes kerlix and coban from toes to 3cm below the knee Electronic Signature(s) Signed: 04/01/2015 5:06:47 PM By: Ronda Fairly, Jonny Ruiz ([id number]) Entered By: Curtis Sites on 04/01/2015 11:18:37 [name] ([id number]) -------------------------------------------------------------------------------- Vitals Details Patient Name: JOURDYN, FERRIN 04/01/2015 10:15 Date of Service: AM Medical Record [id number] Number: Patient Account Number: [id number] Date of Birth/Sex: 12-31-1940 (75 y.o. Male) Treating RN: Curtis Sites Primary Care Other Clinician: Physician: Treating Britto, Threasa Alpha, MARY Physician/Extender: Referring Physician: Derald Macleod in Treatment: 0 Vital Signs Time Taken: 10:59 Temperature (F): 97.7 Height (in): 69 Pulse (bpm): 64 Source: Stated Respiratory Rate (breaths/min): 16 Weight (lbs): 203 Blood Pressure (mmHg): 134/72 Source: Stated Reference Range: 80 - 120 mg / dl Body Mass Index (BMI): 30 Electronic Signature(s) Signed: 04/01/2015 5:06:47 PM By: Curtis Sites Entered By: Curtis Sites on 04/01/2015 11:02:42

## 2015-04-02 NOTE — Progress Notes (Signed)
Jonathan, ROBIDEAU ([id number]) Visit Report for 04/01/2015 Abuse/Suicide Risk Screen Details Patient Name: [name], [name] 04/01/2015 10:15 Date of Service: AM Medical Record [id number] Number: Patient Account Number: [id number] Date of Birth/Sex: 12-Feb-1941 (75 y.o. Male) Treating RN: Curtis Sites Primary Care Other Clinician: Physician: Treating Britto, Threasa Alpha, MARY Physician/Extender: Referring Physician: Derald Macleod in Treatment: 0 Abuse/Suicide Risk Screen Items Answer ABUSE/SUICIDE RISK SCREEN: Has anyone close to you tried to hurt or harm you recentlyo No Do you feel uncomfortable with anyone in your familyo No Has anyone forced you do things that you didnot want to doo No Do you have any thoughts of harming yourselfo No Patient displays signs or symptoms of abuse and/or neglect. No Electronic Signature(s) Signed: 04/01/2015 5:06:47 PM By: Curtis Sites Entered By: Curtis Sites on 04/01/2015 11:03:09 [name] ([id number]) -------------------------------------------------------------------------------- Activities of Daily Living Details Patient Name: Jonathan, Valenzuela 04/01/2015 10:15 Date of Service: AM Medical Record [id number] Number: Patient Account Number: [id number] Date of Birth/Sex: Mar 28, 1940 (75 y.o. Male) Treating RN: Curtis Sites Primary Care Other Clinician: Physician: Treating Britto, Threasa Alpha, MARY Physician/Extender: Referring Physician: Derald Macleod in Treatment: 0 Activities of Daily Living Items Answer Activities of Daily Living (Please select one for each item) Drive Automobile Not Able Take Medications Need Assistance Use Telephone Not Able Care for Appearance Need Assistance Use Toilet Need Assistance Bath / Shower Need Assistance Dress Self Need Assistance Feed Self Need Assistance Walk Need Assistance Get In / Out Bed Need Assistance Housework Not Able Prepare Meals Need Assistance Handle Money Not Able Shop for Self Not  Able Electronic Signature(s) Signed: 04/01/2015 5:06:47 PM By: Curtis Sites Entered By: Curtis Sites on 04/01/2015 11:03:47 [name] ([id number]) -------------------------------------------------------------------------------- Education Assessment Details Patient Name: Jonathan, Valenzuela 04/01/2015 10:15 Date of Service: AM Medical Record [id number] Number: Patient Account Number: [id number] Date of Birth/Sex: May 26, 1940 (75 y.o. Male) Treating RN: Curtis Sites Primary Care Other Clinician: Physician: Treating Britto, Threasa Alpha, MARY Physician/Extender: Referring Physician: Derald Macleod in Treatment: 0 Primary Learner Assessed: Caregiver Reason Patient is not Primary Learner: dementia Learning Preferences/Education Level/Primary Language Learning Preference: Explanation, Demonstration Highest Education Level: College or Above Preferred Language: English Cognitive Barrier Assessment/Beliefs Language Barrier: No Translator Needed: No Memory Deficit: No Emotional Barrier: No Cultural/Religious Beliefs Affecting Medical No Care: Physical Barrier Assessment Impaired Vision: No Impaired Hearing: No Decreased Hand dexterity: No Knowledge/Comprehension Assessment Knowledge Level: Medium Comprehension Level: Medium Ability to understand written Medium instructions: Ability to understand verbal Medium instructions: Motivation Assessment Anxiety Level: Calm Cooperation: Cooperative Education Importance: Acknowledges Need Interest in Health Problems: Asks Questions Perception: Coherent Willingness to Engage in Self- Medium Management Activities: JACOBE, STUDY ([id number]) Readiness to Engage in Self- Medium Management Activities: Electronic Signature(s) Signed: 04/01/2015 5:06:47 PM By: Curtis Sites Entered By: Curtis Sites on 04/01/2015 11:04:14 [name] ([id number]) -------------------------------------------------------------------------------- Fall  Risk Assessment Details Patient Name: Jonathan, Valenzuela 04/01/2015 10:15 Date of Service: AM Medical Record [id number] Number: Patient Account Number: [id number] Date of Birth/Sex: [birth year] (74 y.o. Male) Treating RN: Curtis Sites Primary Care Other Clinician: Physician: Treating Britto, Threasa Alpha, MARY Physician/Extender: Referring Physician: Derald Macleod in Treatment: 0 Fall Risk Assessment Items Have you had 2 or more falls in the last 12 monthso 0 Yes Have you had any fall that resulted in injury in the last 12 monthso 0 No FALL RISK ASSESSMENT: History of falling - immediate or within 3 months 25 Yes Secondary diagnosis 0 No Ambulatory aid None/bed rest/wheelchair/nurse 0 Yes Crutches/cane/walker 0 No Furniture 0 No IV Access/Saline Lock 0  No Gait/Training Normal/bed rest/immobile 0 No Weak 10 Yes Impaired 0 No Mental Status Oriented to own ability 0 Yes Electronic Signature(s) Signed: 04/01/2015 5:06:47 PM By: Curtis Sites Entered By: Curtis Sites on 04/01/2015 11:04:35 [name] ([id number]) -------------------------------------------------------------------------------- Foot Assessment Details Patient Name: Jonathan, Valenzuela 04/01/2015 10:15 Date of Service: AM Medical Record [id number] Number: Patient Account Number: [id number] Date of Birth/Sex: 01/16/1941 (75 y.o. Male) Treating RN: Curtis Sites Primary Care Other Clinician: Physician: Treating Britto, Threasa Alpha, MARY Physician/Extender: Referring Physician: Derald Macleod in Treatment: 0 Foot Assessment Items Site Locations + = Sensation present, - = Sensation absent, C = Callus, U = Ulcer R = Redness, W = Warmth, M = Maceration, PU = Pre-ulcerative lesion F = Fissure, S = Swelling, D = Dryness Assessment Right: Left: Other Deformity: No No Prior Foot Ulcer: No No Prior Amputation: No No Charcot Joint: No No Ambulatory Status: Ambulatory Without Help Gait: Unsteady Electronic  Signature(s) Signed: 04/01/2015 5:06:47 PM By: Curtis Sites Entered By: Curtis Sites on 04/01/2015 11:05:04 [name] ([id number]) Duchesne, Jonny Ruiz ([id number]) -------------------------------------------------------------------------------- Nutrition Risk Assessment Details Patient Name: IVA, POSTEN 04/01/2015 10:15 Date of Service: AM Medical Record [id number] Number: Patient Account Number: [id number] Date of Birth/Sex: November 19, 1940 (75 y.o. Male) Treating RN: Curtis Sites Primary Care Other Clinician: Physician: Treating Britto, Threasa Alpha, MARY Physician/Extender: Referring Physician: Derald Macleod in Treatment: 0 Height (in): 69 Weight (lbs): 203 Body Mass Index (BMI): 30 Nutrition Risk Assessment Items NUTRITION RISK SCREEN: I have an illness or condition that made me change the kind and/or 0 No amount of food I eat I eat fewer than two meals per day 0 No I eat few fruits and vegetables, or milk products 0 No I have three or more drinks of beer, liquor or wine almost every day 0 No I have tooth or mouth problems that make it hard for me to eat 0 No I don't always have enough money to buy the food I need 0 No I eat alone most of the time 0 No I take three or more different prescribed or over-the-counter drugs a 1 Yes day Without wanting to, I have lost or gained 10 pounds in the last six 0 No months I am not always physically able to shop, cook and/or feed myself 0 No Nutrition Protocols Good Risk Protocol 0 No interventions needed Moderate Risk Protocol Electronic Signature(s) Signed: 04/01/2015 5:06:47 PM By: Curtis Sites Entered By: Curtis Sites on 04/01/2015 11:04:41

## 2015-04-08 ENCOUNTER — Encounter (HOSPITAL_BASED_OUTPATIENT_CLINIC_OR_DEPARTMENT_OTHER): Payer: Medicare Other | Admitting: General Surgery

## 2015-04-08 DIAGNOSIS — I83028 Varicose veins of left lower extremity with ulcer other part of lower leg: Secondary | ICD-10-CM | POA: Insufficient documentation

## 2015-04-08 DIAGNOSIS — L97221 Non-pressure chronic ulcer of left calf limited to breakdown of skin: Secondary | ICD-10-CM | POA: Diagnosis not present

## 2015-04-08 DIAGNOSIS — L97829 Non-pressure chronic ulcer of other part of left lower leg with unspecified severity: Principal | ICD-10-CM

## 2015-04-08 NOTE — Progress Notes (Signed)
seeiheal 

## 2015-04-15 ENCOUNTER — Encounter: Payer: Medicare Other | Admitting: Surgery

## 2015-04-15 DIAGNOSIS — L97221 Non-pressure chronic ulcer of left calf limited to breakdown of skin: Secondary | ICD-10-CM | POA: Diagnosis not present

## 2015-04-16 NOTE — Progress Notes (Addendum)
[name], [name] ([id number]) Visit Report for 04/15/2015 Chief Complaint Document Details Patient Name: Jonathan, Valenzuela 04/15/2015 3:00 Date of Service: PM Medical Record [id number] Number: Patient Account Number: [id number] Date of Birth/Sex: 02/16/1941 (75 y.o. Male) Treating RN: Curtis Sites Primary Care [facility], Other Clinician: Physician: RUPASHREE Treating Maximilian Tallo, Verl Blalock, Physician/Extender: Referring Physician: Armida Sans in Treatment: 2 Information Obtained from: Caregiver Chief Complaint Compression wrap change due to swelling of both lower extremities for about a year Electronic Signature(s) Signed: 04/15/2015 3:49:06 PM By: Evlyn Kanner MD, FACS Entered By: Evlyn Kanner on 04/15/2015 15:49:06 [name], [name] ([id number]) -------------------------------------------------------------------------------- HPI Details Patient Name: Jonathan, Valenzuela 04/15/2015 3:00 Date of Service: PM Medical Record [id number] Number: Patient Account Number: [id number] Date of Birth/Sex: 10-Sep-1940 (75 y.o. Male) Treating RN: Curtis Sites Primary Care [facility], Other Clinician: Physician: RUPASHREE Treating Virginia Curl, Verl Blalock, Physician/Extender: Referring Physician: Armida Sans in Treatment: 2 History of Present Illness Location: both lower extremities swollen and having some fluid draining Quality: Patient reports No Pain. Severity: Patient states wound are getting worse. Duration: Patient has had the wound for > 3 months prior to seeking treatment at the wound center Context: The wound appeared gradually over time Modifying Factors: Other treatment(s) tried include:he is at a couple of courses of antibiotics and some wraps applied to his legs Associated Signs and Symptoms: Patient reports having increase swelling. 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 He was generally very healthy until about 4 years ago when the dementia set in and now has been at the [facility]. No formal workup has been done but they tried to wrap his legs which sometimes the patients takes down and has had a couple of courses of antibiotics. Not have a definite history of diabetes and was not a smoker Psychologist, prison and probation services) Signed: 04/15/2015 3:49:16 PM By: Evlyn Kanner MD, FACS Entered By: Evlyn Kanner on 04/15/2015 15:49:16 [name], [name] ([id number]) -------------------------------------------------------------------------------- Physical Exam Details Patient Name: Jonathan, Valenzuela 04/15/2015 3:00 Date of Service: PM Medical Record [id number] Number: Patient Account Number: [id number] Date of Birth/Sex: March 11, 1940 (75 y.o. Male) Treating RN: Curtis Sites Primary Care [facility], Other Clinician: Physician: RUPASHREE Treating Bronc Brosseau, Verl Blalock, Physician/Extender: Referring Physician: Armida Sans in Treatment: 2 Constitutional . Pulse regular. Respirations normal and unlabored. Afebrile. . Eyes Nonicteric. Reactive to light. Ears, Nose, Mouth, and Throat Lips, teeth, and gums WNL.Marland Kitchen Moist mucosa without lesions. Neck supple and nontender. No palpable supraclavicular or cervical adenopathy. Normal sized without goiter. Respiratory WNL. No retractions.. Cardiovascular Pedal Pulses WNL. No clubbing, cyanosis or edema. Lymphatic No adneopathy. No adenopathy. No adenopathy. Musculoskeletal Adexa without tenderness or enlargement.. Digits and nails w/o clubbing, cyanosis, infection, petechiae, ischemia, or inflammatory conditions.. Integumentary (Hair, Skin) No suspicious lesions. No crepitus or fluctuance. No peri-wound warmth or erythema. No masses.Marland Kitchen Psychiatric Judgement and insight Intact.. No evidence of depression, anxiety, or agitation.. Notes both lower extremities have completely healed  and there is no evidence of any ulcerations or weeping. He does have some dry scaly skin but other than that the lymphedema is down significantly. Electronic Signature(s) Signed: 04/15/2015 3:50:40 PM By: Evlyn Kanner MD, FACS Entered By: Evlyn Kanner on 04/15/2015 15:50:40 [name] ([id number]) -------------------------------------------------------------------------------- Physician Orders Details Patient Name: Jonathan, Valenzuela 04/15/2015 3:00 Date of Service: PM Medical Record [id number] Number: Patient Account Number: [id number] Date of Birth/Sex: 1940/11/22 (75 y.o. 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 G30.9 Alzheimer's disease, unspecified Discharge From [facility] Services o Discharge from Wound Care Center Electronic Signature(s) Signed: 04/15/2015 4:56:18 PM By: Evlyn Kanner MD, FACS Signed: 04/15/2015 5:44:12 PM By: Curtis Sites Entered By: Curtis Sites on 04/15/2015 15:56:48 [name] ([id number]) -------------------------------------------------------------------------------- Problem List Details Patient Name: Jonathan, Valenzuela 04/15/2015 3:00 Date of Service: PM Medical Record [id number] Number: Patient Account Number: [id number] Date of Birth/Sex: 1940-04-12 (74 y.o. Male) Treating RN: Curtis Sites Primary Care [name], Other Clinician: Physician: RUPASHREE Treating Fumie Fiallo, Verl Blalock, Physician/Extender: Referring Physician: Armida Sans in Treatment:  2 Active Problems ICD-10 Encounter Code Description Active Date Diagnosis I89.0 Lymphedema, not elsewhere classified 04/01/2015 Yes L97.221 Non-pressure chronic ulcer of left calf limited to 04/01/2015 Yes breakdown of skin L97.211 Non-pressure chronic ulcer of right calf limited to 04/01/2015 Yes breakdown of skin G30.9 Alzheimer's disease, unspecified 04/01/2015 Yes Inactive Problems Resolved Problems Electronic Signature(s) Signed: 04/15/2015 3:48:59 PM By: Evlyn Kanner MD, FACS Entered By: Evlyn Kanner on 04/15/2015 15:48:58 [name] ([id number]) -------------------------------------------------------------------------------- Progress Note Details Patient Name: Jonathan, Valenzuela 04/15/2015 3:00 Date of Service: PM Medical Record [id number] Number: Patient Account Number: [id number] Date of Birth/Sex: Aug 16, 1940 (75 y.o. Male) Treating RN: Curtis Sites Primary Care [facility], Other Clinician: Physician: RUPASHREE Treating Jamariya Davidoff, Verl Blalock, Physician/Extender: Referring Physician: Armida Sans in Treatment: 2 Subjective Chief Complaint Information obtained from Caregiver Compression wrap change due to swelling of both lower extremities for about a year History of Present Illness (HPI) The following HPI elements were documented for the patient's wound: Location: both lower extremities swollen and having some fluid draining Quality: Patient reports No Pain. Severity: Patient states wound are getting worse. Duration: Patient has had the wound for > 3 months prior to seeking treatment at the wound center Context: The wound appeared gradually over time Modifying Factors: Other treatment(s) tried include:he is at a couple of courses of antibiotics and some wraps applied to his legs Associated Signs and Symptoms: Patient reports having increase swelling. 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 He was generally very healthy until about 4 years ago when the dementia set in and now has been at the [facility]. No formal workup has been done but they tried to wrap his legs which sometimes the patients takes down and has had a couple of courses of antibiotics. Not have a definite history of diabetes and was not a smoker Objective Constitutional Pulse regular. Respirations normal and unlabored. Afebrile. Vitals Time Taken: 3:36 PM, Height: 69 in, Weight: 203 lbs, BMI: 30, Pulse: 74 bpm, Respiratory Rate: 18 breaths/min, Blood Pressure: 125/55 mmHg. Jonathan, Valenzuela ([id number]) Eyes Nonicteric. Reactive to light. Ears, Nose, Mouth, and Throat Lips, teeth, and gums WNL.Marland Kitchen Moist mucosa without lesions. Neck supple and nontender. No palpable supraclavicular or cervical adenopathy. Normal sized without goiter. Respiratory WNL. No retractions.. Cardiovascular Pedal Pulses WNL. No clubbing, cyanosis or edema. Lymphatic No adneopathy. No adenopathy. No adenopathy. Musculoskeletal Adexa without tenderness or enlargement.. Digits and nails w/o clubbing, cyanosis, infection, petechiae, ischemia, or inflammatory conditions.Marland Kitchen Psychiatric Judgement  and insight Intact.. No evidence of depression, anxiety, or agitation.. General Notes: both lower extremities have completely healed and there is no evidence of any ulcerations or weeping. He does have some dry scaly skin but other than that the lymphedema is down significantly. Integumentary (Hair, Skin) No suspicious lesions. No crepitus or fluctuance. No peri-wound warmth or erythema. No masses.. Wound #1 status is Healed - Epithelialized. Original cause of wound was Gradually Appeared. The wound is located on the Left,Lateral Lower Leg. The wound measures 0cm length x 0cm width x 0cm depth; 0cm^2 area and 0cm^3 volume. 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 He is at an assisted living facility and we will recommend that they continue with elevation exercise and compression stockings on a regular basis. He is discharged on the wound care services and will be seen back as needed. Plan Discharge From [facility] Services: Discharge from Wound Care Center Since his wounds are completely healed have discontinued his compression wraps and have recommended compression stockings of the 20-30 mm variety. He is at an assisted living facility and we will recommend that they continue with elevation exercise and compression stockings on a regular basis. He is discharged on the wound care services and will be seen back as needed. Electronic Signature(s) Signed: 04/15/2015 4:58:22 PM By: Evlyn Kanner MD, FACS Previous Signature: 04/15/2015 3:52:01 PM Version By: Evlyn Kanner MD, FACS Entered By: Evlyn Kanner on 04/15/2015 16:58:22 Jonathan, Valenzuela ([id number]) -------------------------------------------------------------------------------- SuperBill Details Patient Name: [name] Date of Service: 04/15/2015 Medical Record Patient Account Number: [id number] Number: Dorthy, Treating RN: Date of Birth/Sex: 1940/10/07 (75 y.o. Male) Baldo Daub, Other Clinician: Primary Care Physician: RUPASHREE Treating Nimo Verastegui, Verl Blalock, Physician/Extender: Referring Physician: Armida Sans in Treatment: 2 Diagnosis Coding ICD-10 Codes Code Description I89.0 Lymphedema, not elsewhere classified L97.221 Non-pressure chronic ulcer of left calf limited to  breakdown of skin L97.211 Non-pressure chronic ulcer of right calf limited to breakdown of skin G30.9 Alzheimer's disease, unspecified Facility Procedures CPT4 Code: [id number] Description: 30865 - WOUND CARE VISIT-LEV 2 EST PT Modifier: Quantity: 1 Physician Procedures CPT4 Code Description: [id number] - WC PHYS LEVEL 3 - EST PT ICD-10 Description Diagnosis I89.0 Lymphedema, not elsewhere classified L97.221 Non-pressure chronic ulcer of left calf limited to L97.211 Non-pressure chronic ulcer of right calf limited to  G30.9 Alzheimer's disease, unspecified Modifier: breakdown of skin breakdown of ski Quantity: 1 n Electronic Signature(s) Signed: 04/15/2015 5:12:40 PM By: Curtis Sites Previous Signature: 04/15/2015 3:52:15 PM Version By: Evlyn Kanner MD, FACS Entered By: Curtis Sites on 04/15/2015 17:12:40

## 2015-04-16 NOTE — Progress Notes (Signed)
[name] ([id number]) Visit Report for 04/15/2015 Arrival Information Details Patient Name: [name], [name] 04/15/2015 3:00 Date of Service: PM Medical Record [id number] Number: Patient Account Number: [id number] Date of Birth/Sex: Apr 03, 1940 (75 y.o. Male) Treating RN: Curtis Sites Primary Care [facility], Other Clinician: Physician: RUPASHREE Treating Britto, Verl Blalock, Physician/Extender: Referring Physician: Armida Sans in Treatment: 2 Visit Information History Since Last Visit Added or deleted any medications: No Patient Arrived: Ambulatory Any new allergies or adverse reactions: No Arrival Time: 15:32 Had a fall or experienced change in No Accompanied By: son activities of daily living that may affect Transfer Assistance: None risk of falls: Patient Identification Verified: Yes Signs or symptoms of abuse/neglect since last No Secondary Verification Process Yes visito Completed: Hospitalized since last visit: No Pain Present Now: No Electronic Signature(s) Signed: 04/15/2015 5:44:12 PM By: Curtis Sites Entered By: Curtis Sites on 04/15/2015 15:34:19 [name] ([id number]) -------------------------------------------------------------------------------- Clinic Level of Care Assessment Details Patient Name: [name], [name] 04/15/2015 3:00 Date of Service: PM Medical Record [id number] Number: Patient Account Number: [id number] Date of Birth/Sex: December 05, 1940 (75 y.o. Male) Treating RN: Curtis Sites Primary Care [name], Other Clinician: Physician: RUPASHREE Treating Britto, Verl Blalock, Physician/Extender: Referring Physician: Armida Sans in Treatment: 2 Clinic Level of Care Assessment Items TOOL 4 Quantity Score []  - Use when only an EandM is performed on FOLLOW-UP visit 0 ASSESSMENTS - Nursing Assessment / Reassessment X - Reassessment of Co-morbidities (includes updates in patient status) 1 10 X - Reassessment of Adherence to  Treatment Plan 1 5 ASSESSMENTS - Wound and Skin Assessment / Reassessment X - Simple Wound Assessment / Reassessment - one wound 1 5 []  - Complex Wound Assessment / Reassessment - multiple wounds 0 []  - Dermatologic / Skin Assessment (not related to wound area) 0 ASSESSMENTS - Focused Assessment []  - Circumferential Edema Measurements - multi extremities 0 []  - Nutritional Assessment / Counseling / Intervention 0 X - Lower Extremity Assessment (monofilament, tuning fork, pulses) 1 5 []  - Peripheral Arterial Disease Assessment (using hand held doppler) 0 ASSESSMENTS - Ostomy and/or Continence Assessment and Care []  - Incontinence Assessment and Management 0 []  - Ostomy Care Assessment and Management (repouching, etc.) 0 PROCESS - Coordination of Care X - Simple Patient / Family Education for ongoing care 1 15 []  - Complex (extensive) Patient / Family Education for ongoing care 0 []  - Staff obtains Consents, Records, Test Results / Process Orders 0 DAREY, HERSHBERGER ([id number]) []  - Staff telephones HHA, Nursing Homes / Clarify orders / etc 0 []  - Routine Transfer to another Facility (non-emergent condition) 0 []  - Routine Hospital Admission (non-emergent condition) 0 []  - New Admissions / Manufacturing engineer / Ordering NPWT, Apligraf, etc. 0 []  - Emergency Hospital Admission (emergent condition) 0 X - Simple Discharge Coordination 1 10 []  - Complex (extensive) Discharge Coordination 0 PROCESS - Special Needs []  - Pediatric / Minor Patient Management 0 []  - Isolation Patient Management 0 []  - Hearing / Language / Visual special needs 0 []  - Assessment of Community assistance (transportation, D/C planning, etc.) 0 []  - Additional assistance / Altered mentation 0 []  - Support Surface(s) Assessment (bed, cushion, seat, etc.) 0 INTERVENTIONS - Wound Cleansing / Measurement X - Simple Wound Cleansing - one wound 1 5 []  - Complex Wound Cleansing - multiple wounds 0 X - Wound Imaging  (photographs - any number of wounds) 1 5 []  - Wound Tracing (instead of photographs) 0 X - Simple Wound Measurement - one wound 1 5 []  - Complex Wound Measurement -  multiple wounds 0 INTERVENTIONS - Wound Dressings  - Small Wound Dressing one or multiple wounds 0  - Medium Wound Dressing one or multiple wounds 0  - Large Wound Dressing one or multiple wounds 0  - Application of Medications - topical 0  - Application of Medications - injection 0 SHARROD, ACHILLE ([id number]) INTERVENTIONS - Miscellaneous  - External ear exam 0  - Specimen Collection (cultures, biopsies, blood, body fluids, etc.) 0  - Specimen(s) / Culture(s) sent or taken to Lab for analysis 0  - Patient Transfer (multiple staff / Michiel Sites Lift / Similar devices) 0  - Simple Staple / Suture removal (25 or less) 0  - Complex Staple / Suture removal (26 or more) 0  - Hypo / Hyperglycemic Management (close monitor of Blood Glucose) 0  - Ankle / Brachial Index (ABI) - do not check if billed separately 0 X - Vital Signs 1 5 Has the patient been seen at the hospital within the last three years: Yes Total Score: 70 Level Of Care: New/Established - Level 2 Electronic Signature(s) Signed: 04/15/2015 5:12:32 PM By: Curtis Sites Entered By: Curtis Sites on 04/15/2015 17:12:32 [name] ([id number]) -------------------------------------------------------------------------------- Encounter Discharge Information Details Patient Name: [name] 04/15/2015 3:00 Date of Service: PM Medical Record [id number] Number: Patient Account Number: [id number] Date of Birth/Sex: [birth year] (75 y.o. Male) Treating RN: Curtis Sites Primary Care [facility], Other Clinician: Physician: RUPASHREE Treating Britto, Verl Blalock, Physician/Extender: Referring Physician: Armida Sans in Treatment: 2 Encounter Discharge Information Items Discharge Pain Level: 0 Discharge Condition: Stable Ambulatory Status:  Ambulatory Discharge Destination: Home Transportation: Private Auto Accompanied By: brother Schedule Follow-up Appointment: Yes Medication Reconciliation completed and provided to Patient/Care No Elois Averitt: Provided on Clinical Summary of Care: 04/15/2015 Form Type Recipient Paper Patient JM Electronic Signature(s) Signed: 04/15/2015 5:13:01 PM By: Curtis Sites Previous Signature: 04/15/2015 3:56:59 PM Version By: Gwenlyn Perking Entered By: Curtis Sites on 04/15/2015 17:13:01 [name] ([id number]) -------------------------------------------------------------------------------- Multi-Disciplinary Care Plan Details Patient Name: [name], [name] 04/15/2015 3:00 Date of Service: PM Medical Record [id number] Number: Patient Account Number: [id number] Date of Birth/Sex: 30-Jun-1940 (74 y.o. Male) Treating RN: Curtis Sites Primary Care [facility], Other Clinician: Physician: RUPASHREE Treating Britto, Verl Blalock, Physician/Extender: Referring Physician: Armida Sans in Treatment: 2 Active Inactive Electronic Signature(s) Signed: 04/15/2015 5:44:12 PM By: Curtis Sites Entered By: Curtis Sites on 04/15/2015 15:56:35 [name] ([id number]) -------------------------------------------------------------------------------- Patient/Caregiver Education Details Patient Name: [name], [name] 04/15/2015 3:00 Date of Service: PM Medical Record [id number] Number: Patient Account Number: [id number] Date of Birth/Gender: 11/11/1940 (75 y.o. Male) Treating RN: Curtis Sites Primary Care [facility], Other Clinician: Physician: RUPASHREE Treating Britto, Verl Blalock, Physician/Extender: Referring Physician: Armida Sans in Treatment: 2 Education Assessment Education Provided To: Caregiver Education Topics Provided Venous: Handouts: Controlling Swelling with Compression Stockings Methods: Explain/Verbal, Printed Responses: State content correctly Electronic  Signature(s) Signed: 04/15/2015 5:13:29 PM By: Curtis Sites Entered By: Curtis Sites on 04/15/2015 17:13:29 [name] ([id number]) -------------------------------------------------------------------------------- Wound Assessment Details Patient Name: [name], [name] 04/15/2015 3:00 Date of Service: PM Medical Record [id number] Number: Patient Account Number: [id number] Date of Birth/Sex: 1940-10-15 (75 y.o. Male) Treating RN: Curtis Sites Primary Care [facility], Other Clinician: Physician: RUPASHREE Treating Britto, Verl Blalock, Physician/Extender: Referring Physician: Armida Sans in Treatment: 2 Wound Status Wound Number: 1 Primary Etiology: Venous Leg Ulcer Wound Location: Left, Lateral Lower Leg Wound Status: Healed - Epithelialized Wounding Event: Gradually Appeared Date Acquired: 12/27/2014 Weeks Of Treatment: 2 Clustered Wound: Yes Photos Photo Uploaded By: Curtis Sites on 04/15/2015 17:21:03 Wound Measurements Length: (cm)  0 % Reduction in Width: (cm) 0 % Reduction in Depth: (cm) 0 Area: (cm) 0 Volume: (cm) 0 Area: 100% Volume: 100% Wound Description Classification: Partial Thickness Periwound Skin Texture Texture Color No Abnormalities Noted: No No Abnormalities Noted: No Moisture No Abnormalities Noted: No HASAAN, RADDE ([id number]) Electronic Signature(s) Signed: 04/15/2015 5:44:12 PM By: Curtis Sites Entered By: Curtis Sites on 04/15/2015 15:56:17 [name] ([id number]) -------------------------------------------------------------------------------- Vitals Details Patient Name: [name], [name] 04/15/2015 3:00 Date of Service: PM Medical Record [id number] Number: Patient Account Number: [id number] Date of Birth/Sex: [birth year] (74 y.o. Male) Treating RN: Curtis Sites [name], Other Clinician: Physician: RUPASHREE Treating Britto, Verl Blalock, Physician/Extender: Referring Physician: Armida Sans in  Treatment: 2 Vital Signs Time Taken: 15:36 Pulse (bpm): 74 Height (in): 69 Respiratory Rate (breaths/min): 18 Weight (lbs): 203 Blood Pressure (mmHg): 125/55 Body Mass Index (BMI): 30 Reference Range: 80 - 120 mg / dl Electronic Signature(s) Signed: 04/15/2015 5:44:12 PM By: Curtis Sites Entered By: Curtis Sites on 04/15/2015 15:36:21

## 2015-04-25 NOTE — Progress Notes (Signed)
[name], Bao ([id number]) Visit Report for 04/08/2015 Arrival Information Details Patient Name: [name], [name] 04/08/2015 12:45 Date of Service: PM Medical Record [id number] Number: Patient Account Number: [id number] Date of Birth/Sex: 08/09/1940 7(74 y.o. Male) Treating RN: Curtis Sitesorthy, Joanna [facility], Other Clinician: Physician: RUPASHREE Treating Christiana PellantPARKER, PETER VARADARAJAN, Physician/Extender: Referring Physician: Armida SansUPASHREE Weeks in Treatment: 1 Visit Information History Since Last Visit Added or deleted any medications: No Patient Arrived: Ambulatory Any new allergies or adverse reactions: No Arrival Time: 12:48 Had a fall or experienced change in No Accompanied By: spouse activities of daily living that may affect Transfer Assistance: None risk of falls: Patient Identification Verified: Yes Signs or symptoms of abuse/neglect since last No Secondary Verification Process Yes visito Completed: Hospitalized since last visit: No Pain Present Now: No Electronic Signature(s) Signed: 04/08/2015 4:52:57 PM By: Curtis Sitesorthy, Joanna Entered By: Curtis Sitesorthy, Joanna on 04/08/2015 12:56:04 [name], Jereld ([id number]) -------------------------------------------------------------------------------- Clinic Level of Care Assessment Details Patient Name: [name], [name] 04/08/2015 12:45 Date of Service: PM Medical Record [id number] Number: Patient Account Number: [id number] Date of Birth/Sex: 12/04/1940 [age] y.o. Male) Treating RN: Curtis Sitesorthy, Joanna Primary Care VARADARAJAN, Other Clinician: Physician: RUPASHREE Treating Christiana PellantPARKER, PETER VARADARAJAN, Physician/Extender: Referring Physician: Armida SansUPASHREE Weeks in Treatment: 1 Clinic Level of Care Assessment Items TOOL 4 Quantity Score []  - Use when only an EandM is performed on FOLLOW-UP visit 0 ASSESSMENTS - Nursing Assessment / Reassessment X - Reassessment of Co-morbidities (includes updates in patient status) 1 10 X - Reassessment of Adherence to  Treatment Plan 1 5 ASSESSMENTS - Wound and Skin Assessment / Reassessment []  - Simple Wound Assessment / Reassessment - one wound 0 X - Complex Wound Assessment / Reassessment - multiple wounds 2 5 []  - Dermatologic / Skin Assessment (not related to wound area) 0 ASSESSMENTS - Focused Assessment []  - Circumferential Edema Measurements - multi extremities 0 []  - Nutritional Assessment / Counseling / Intervention 0 X - Lower Extremity Assessment (monofilament, tuning fork, pulses) 1 5 []  - Peripheral Arterial Disease Assessment (using hand held doppler) 0 ASSESSMENTS - Ostomy and/or Continence Assessment and Care []  - Incontinence Assessment and Management 0 []  - Ostomy Care Assessment and Management (repouching, etc.) 0 PROCESS - Coordination of Care X - Simple Patient / Family Education for ongoing care 1 15 []  - Complex (extensive) Patient / Family Education for ongoing care 0 []  - Staff obtains Consents, Records, Test Results / Process Orders 0 [name], [name] ([id number]) []  - Staff telephones HHA, Nursing Homes / Clarify orders / etc 0 []  - Routine Transfer to another Facility (non-emergent condition) 0 []  - Routine Hospital Admission (non-emergent condition) 0 []  - New Admissions / [employer] Authorizations / Ordering NPWT, Apligraf, etc. 0 []  - Emergency Hospital Admission (emergent condition) 0 X - Simple Discharge Coordination 1 10 []  - Complex (extensive) Discharge Coordination 0 PROCESS - Special Needs []  - Pediatric / Minor Patient Management 0 []  - Isolation Patient Management 0 []  - Hearing / Language / Visual special needs 0 []  - Assessment of Community assistance (transportation, D/C planning, etc.) 0 []  - Additional assistance / Altered mentation 0 []  - Support Surface(s) Assessment (bed, cushion, seat, etc.) 0 INTERVENTIONS - Wound Cleansing / Measurement []  - Simple Wound Cleansing - one wound 0 X - Complex Wound Cleansing - multiple wounds 2 5 X - Wound Imaging  (photographs - any number of wounds) 1 5 []  - Wound Tracing (instead of photographs) 0 []  - Simple Wound Measurement - one wound 0 X - Complex Wound Measurement -  multiple wounds 2 5 INTERVENTIONS - Wound Dressings []  - Small Wound Dressing one or multiple wounds 0 X - Medium Wound Dressing one or multiple wounds 1 15 []  - Large Wound Dressing one or multiple wounds 0 []  - Application of Medications - topical 0 []  - Application of Medications - injection 0 Jonathan, Valenzuela ([id number]) INTERVENTIONS - Miscellaneous []  - External ear exam 0 []  - Specimen Collection (cultures, biopsies, blood, body fluids, etc.) 0 []  - Specimen(s) / Culture(s) sent or taken to Lab for analysis 0 []  - Patient Transfer (multiple staff / Nurse, adult / Similar devices) 0 []  - Simple Staple / Suture removal (25 or less) 0 []  - Complex Staple / Suture removal (26 or more) 0 []  - Hypo / Hyperglycemic Management (close monitor of Blood Glucose) 0 []  - Ankle / Brachial Index (ABI) - do not check if billed separately 0 X - Vital Signs 1 5 Has the patient been seen at the hospital within the last three years: Yes Total Score: 100 Level Of Care: New/Established - Level 3 Electronic Signature(s) Signed: 04/08/2015 4:18:30 PM By: Curtis Sites Entered By: Curtis Sites on 04/08/2015 16:18:30 Jonathan Reedy ([id number]) -------------------------------------------------------------------------------- Encounter Discharge Information Details Patient Name: Jonathan, Valenzuela 04/08/2015 12:45 Date of Service: PM Medical Record [id number] Number: Patient Account Number: [id number] Date of Birth/Sex: 1940/08/14 (75 y.o. Male) Treating RN: Curtis Sites Primary Care [facility], Other Clinician: Physician: RUPASHREE Treating Christiana Pellant, Physician/Extender: Referring Physician: Armida Sans in Treatment: 1 Encounter Discharge Information Items Discharge Pain Level: 0 Discharge Condition: Stable Ambulatory  Status: Ambulatory Discharge Destination: Home Transportation: Private Auto Accompanied By: spouse Schedule Follow-up Appointment: Yes Medication Reconciliation completed and provided to Patient/Care No Bobbi Yount: Provided on Clinical Summary of Care: 04/08/2015 Form Type Recipient Paper Patient JM Electronic Signature(s) Signed: 04/08/2015 4:19:17 PM By: Curtis Sites Previous Signature: 04/08/2015 1:31:37 PM Version By: Gwenlyn Perking Entered By: Curtis Sites on 04/08/2015 16:19:17 Jonathan Reedy ([id number]) -------------------------------------------------------------------------------- Lower Extremity Assessment Details Patient Name: NAJEH, CREDIT 04/08/2015 12:45 Date of Service: PM Medical Record [id number] Number: Patient Account Number: [id number] Date of Birth/Sex: 03-Apr-1940 (74 y.o. Male) Treating RN: Curtis Sites Primary Care [facility], Other Clinician: Physician: RUPASHREE Treating Christiana Pellant, Physician/Extender: Referring Physician: Armida Sans in Treatment: 1 Edema Assessment Assessed: [Left: No] [Right: No] Edema: [Left: Yes] [Right: Yes] Vascular Assessment Pulses: Posterior Tibial Dorsalis Pedis Palpable: [Left:Yes] [Right:Yes] Extremity colors, hair growth, and conditions: Extremity Color: [Left:Red] [Right:Red] Hair Growth on Extremity: [Left:No] [Right:No] Temperature of Extremity: [Left:Warm] [Right:Warm] Capillary Refill: [Left:< 3 seconds] [Right:< 3 seconds] Electronic Signature(s) Signed: 04/08/2015 4:52:57 PM By: Curtis Sites Entered By: Curtis Sites on 04/08/2015 13:07:56 Jonathan Reedy ([id number]) -------------------------------------------------------------------------------- Multi Wound Chart Details Patient Name: AVYUKT, CIMO 04/08/2015 12:45 Date of Service: PM Medical Record [id number] Number: Patient Account Number: [id number] Date of Birth/Sex: January 10, 1941 (75 y.o. Male) Treating RN: Curtis Sites Primary  Care [facility], Other Clinician: Physician: RUPASHREE Treating Christiana Pellant, Physician/Extender: Referring Physician: Armida Sans in Treatment: 1 Vital Signs Height(in): 69 Pulse(bpm): 60 Weight(lbs): 203 Blood Pressure 143/61 (mmHg): Body Mass Index(BMI): 30 Temperature(F): 97.9 Respiratory Rate 18 (breaths/min): Photos: [1:No Photos] [2:No Photos] [N/A:N/A] Wound Location: [1:Left Lower Leg - Lateral] [2:Right, Lateral Lower Leg] [N/A:N/A] Wounding Event: [1:Gradually Appeared] [2:Gradually Appeared] [N/A:N/A] Primary Etiology: [1:Venous Leg Ulcer] [2:Venous Leg Ulcer] [N/A:N/A] Comorbid History: [1:Cataracts, Dementia] [2:N/A] [N/A:N/A] Date Acquired: [1:12/27/2014] [2:12/27/2014] [N/A:N/A] Weeks of Treatment: [1:1] [2:1] [N/A:N/A] Wound Status: [1:Open] [2:Healed - Epithelialized] [N/A:N/A] Clustered Wound: [1:Yes] [2:No] [N/A:N/A] Measurements L x W x  D 0.5x0.5x0.1 [2:0x0x0] [N/A:N/A] (cm) Area (cm) : [1:0.196] [2:0] [N/A:N/A] Volume (cm) : [1:0.02] [2:0] [N/A:N/A] % Reduction in Area: [1:85.80%] [2:100.00%] [N/A:N/A] % Reduction in Volume: 85.50% [2:100.00%] [N/A:N/A] Classification: [1:Partial Thickness] [2:Partial Thickness] [N/A:N/A] Exudate Amount: [1:Medium] [2:N/A] [N/A:N/A] Exudate Type: [1:Serous] [2:N/A] [N/A:N/A] Exudate Color: [1:amber] [2:N/A] [N/A:N/A] Wound Margin: [1:Flat and Intact] [2:N/A] [N/A:N/A] Granulation Amount: [1:Large (67-100%)] [2:N/A] [N/A:N/A] Granulation Quality: [1:Pink] [2:N/A] [N/A:N/A] Necrotic Amount: [1:None Present (0%)] [2:N/A] [N/A:N/A] Exposed Structures: [1:Fascia: No Fat: No Tendon: No Muscle: No] [2:N/A] [N/A:N/A] Joint: No Bone: No Limited to Skin Breakdown Epithelialization: Medium (34-66%) N/A N/A Periwound Skin Texture: Edema: No No Abnormalities Noted N/A Excoriation: No Induration: No Callus: No Crepitus: No Fluctuance: No Friable: No Rash: No Scarring: No Periwound Skin Maceration:  No No Abnormalities Noted N/A Moisture: Moist: No Dry/Scaly: No Periwound Skin Color: Atrophie Blanche: No No Abnormalities Noted N/A Cyanosis: No Ecchymosis: No Erythema: No Hemosiderin Staining: No Mottled: No Pallor: No Rubor: No Temperature: No Abnormality N/A N/A Tenderness on No No N/A Palpation: Wound Preparation: Ulcer Cleansing: N/A N/A Rinsed/Irrigated with Saline Topical Anesthetic Applied: None Treatment Notes Electronic Signature(s) Signed: 04/08/2015 4:52:57 PM By: Curtis Sites Entered By: Curtis Sites on 04/08/2015 13:15:53 Jonathan Reedy ([id number]) -------------------------------------------------------------------------------- Multi-Disciplinary Care Plan Details Patient Name: ARVIL, UTZ 04/08/2015 12:45 Date of Service: PM Medical Record [id number] Number: Patient Account Number: [id number] Date of Birth/Sex: Sep 15, 1940 (75 y.o. Male) Treating RN: Curtis Sites Primary Care [facility], Other Clinician: Physician: RUPASHREE Treating Christiana Pellant, Physician/Extender: Referring Physician: Armida Sans in Treatment: 1 Active Inactive Abuse / Safety / Falls / Self Care Management Nursing Diagnoses: Impaired physical mobility Potential for falls Goals: Patient will remain injury free Date Initiated: 04/01/2015 Goal Status: Active Interventions: Assess fall risk on admission and as needed Notes: Orientation to the Wound Care Program Nursing Diagnoses: Knowledge deficit related to the wound healing center program Goals: Patient/caregiver will verbalize understanding of the Wound Healing Center Program Date Initiated: 04/01/2015 Goal Status: Active Interventions: Provide education on orientation to the wound center Notes: Venous Leg Ulcer Nursing Diagnoses: Potential for venous Insuffiency (use before diagnosis confirmed) ZAKHI, DUPRE ([id number]) Goals: Patient will maintain optimal edema control Date Initiated:  04/01/2015 Goal Status: Active Interventions: Assess peripheral edema status every visit. Notes: Wound/Skin Impairment Nursing Diagnoses: Impaired tissue integrity Goals: Patient/caregiver will verbalize understanding of skin care regimen Date Initiated: 04/01/2015 Goal Status: Active Ulcer/skin breakdown will have a volume reduction of 30% by week 4 Date Initiated: 04/01/2015 Goal Status: Active Ulcer/skin breakdown will have a volume reduction of 50% by week 8 Date Initiated: 04/01/2015 Goal Status: Active Ulcer/skin breakdown will have a volume reduction of 80% by week 12 Date Initiated: 04/01/2015 Goal Status: Active Ulcer/skin breakdown will heal within 14 weeks Date Initiated: 04/01/2015 Goal Status: Active Interventions: Assess ulceration(s) every visit Notes: Electronic Signature(s) Signed: 04/08/2015 4:52:57 PM By: Curtis Sites Entered By: Curtis Sites on 04/08/2015 13:15:45 Jonathan Reedy ([id number]) -------------------------------------------------------------------------------- Patient/Caregiver Education Details Patient Name: DEYLAN, [place] 04/08/2015 12:45 Date of Service: PM Medical Record [id number] Number: Patient Account Number: [id number] Date of Birth/Gender: [birth year] (75 y.o. Male) Treating RN: Curtis Sites Primary Care [facility], Other Clinician: Physician: RUPASHREE Treating Christiana Pellant, Physician/Extender: Referring Physician: Armida Sans in Treatment: 1 Education Assessment Education Provided To: Caregiver Education Topics Provided Venous: Handouts: Other: edema management Methods: Explain/Verbal Responses: State content correctly Electronic Signature(s) Signed: 04/08/2015 4:19:33 PM By: Curtis Sites Entered By: Curtis Sites on 04/08/2015 16:19:33 Jonathan Reedy ([id number]) -------------------------------------------------------------------------------- Wound Assessment Details Patient Name: Jonathan Reedy  04/08/2015  12:45 Date of Service: PM Medical Record [id number] Number: Patient Account Number: [id number] Date of Birth/Sex: 1940/09/10 (75 y.o. Male) Treating RN: Curtis Sites Primary Care [facility], Other Clinician: Physician: RUPASHREE Treating Christiana Pellant, Physician/Extender: Referring Physician: Armida Sans in Treatment: 1 Wound Status Wound Number: 1 Primary Etiology: Venous Leg Ulcer Wound Location: Left Lower Leg - Lateral Wound Status: Open Wounding Event: Gradually Appeared Comorbid History: Cataracts, Dementia Date Acquired: 12/27/2014 Weeks Of Treatment: 1 Clustered Wound: Yes Photos Photo Uploaded By: Curtis Sites on 04/08/2015 16:37:32 Wound Measurements Length: (cm) 0.5 Width: (cm) 0.5 Depth: (cm) 0.1 Area: (cm) 0.196 Volume: (cm) 0.02 % Reduction in Area: 85.8% % Reduction in Volume: 85.5% Epithelialization: Medium (34-66%) Tunneling: No Undermining: No Wound Description Classification: Partial Thickness Foul Odor After Wound Margin: Flat and Intact Exudate Amount: Medium Exudate Type: Serous Exudate Color: amber Cleansing: No Wound Bed Granulation Amount: Large (67-100%) Exposed Structure Granulation Quality: Pink Fascia Exposed: No OLAOLUWA, GRIEDER ([id number]) Necrotic Amount: None Present (0%) Fat Layer Exposed: No Tendon Exposed: No Muscle Exposed: No Joint Exposed: No Bone Exposed: No Limited to Skin Breakdown Periwound Skin Texture Texture Color No Abnormalities Noted: No No Abnormalities Noted: No Callus: No Atrophie Blanche: No Crepitus: No Cyanosis: No Excoriation: No Ecchymosis: No Fluctuance: No Erythema: No Friable: No Hemosiderin Staining: No Induration: No Mottled: No Localized Edema: No Pallor: No Rash: No Rubor: No Scarring: No Temperature / Pain Moisture Temperature: No Abnormality No Abnormalities Noted: No Dry / Scaly: No Maceration: No Moist: No Wound Preparation Ulcer Cleansing:  Rinsed/Irrigated with Saline Topical Anesthetic Applied: None Electronic Signature(s) Signed: 04/08/2015 4:52:57 PM By: Curtis Sites Entered By: Curtis Sites on 04/08/2015 13:08:09 Jonathan Reedy ([id number]) -------------------------------------------------------------------------------- Wound Assessment Details Patient Name: JORMA, TASSINARI 04/08/2015 12:45 Date of Service: PM Medical Record [id number] Number: Patient Account Number: [id number] Date of Birth/Sex: Jul 09, 1940 (75 y.o. Male) Treating RN: Curtis Sites Primary Care [name], Other Clinician: Physician: RUPASHREE Treating Christiana Pellant, Physician/Extender: Referring Physician: Armida Sans in Treatment: 1 Wound Status Wound Number: 2 Primary Etiology: Venous Leg Ulcer Wound Location: Right, Lateral Lower Leg Wound Status: Healed - Epithelialized Wounding Event: Gradually Appeared Date Acquired: 12/27/2014 Weeks Of Treatment: 1 Clustered Wound: No Photos Photo Uploaded By: Curtis Sites on 04/08/2015 16:37:32 Wound Measurements Length: (cm) 0 % Reduction i Width: (cm) 0 % Reduction i Depth: (cm) 0 Area: (cm) 0 Volume: (cm) 0 n Area: 100% n Volume: 100% Wound Description Classification: Partial Thickness Periwound Skin Texture Texture Color No Abnormalities Noted: No No Abnormalities Noted: No Moisture No Abnormalities Noted: No HELIOS, KOHLMANN ([id number]) Electronic Signature(s) Signed: 04/08/2015 4:52:57 PM By: Curtis Sites Entered By: Curtis Sites on 04/08/2015 13:07:39 Jonathan Reedy ([id number]) -------------------------------------------------------------------------------- Vitals Details Patient Name: ODILON, CASS 04/08/2015 12:45 Date of Service: PM Medical Record [id number] Number: Patient Account Number: [id number] Date of Birth/Sex: 26-Aug-1940 (75 y.o. Male) Treating RN: Curtis Sites Primary Care [facility], Other Clinician: Physician: RUPASHREE Treating Christiana Pellant, Physician/Extender: Referring Physician: Armida Sans in Treatment: 1 Vital Signs Time Taken: 12:56 Temperature (F): 97.9 Height (in): 69 Pulse (bpm): 60 Weight (lbs): 203 Respiratory Rate (breaths/min): 18 Body Mass Index (BMI): 30 Blood Pressure (mmHg): 143/61 Reference Range: 80 - 120 mg / dl Electronic Signature(s) Signed: 04/08/2015 4:52:57 PM By: Curtis Sites Entered By: Curtis Sites on 04/08/2015 12:56:31

## 2015-04-25 NOTE — Progress Notes (Signed)
ANNIE, ROSEBOOM ([id number]) Visit Report for 04/08/2015 Chief Complaint Document Details Patient Name: [name], [name] 04/08/2015 12:45 Date of Service: PM Medical Record [id number] Number: Patient Account Number: [id number] Date of Birth/Sex: 10-Sep-1940 (75 y.o. Male) Treating RN: Curtis Sites Primary Care [facility], Other Clinician: Physician: RUPASHREE Treating Christiana Pellant, Physician/Extender: Referring Physician: Armida Sans in Treatment: 1 Information Obtained from: Caregiver Chief Complaint Compression wrap change due to swelling of both lower extremities for about a year Electronic Signature(s) Signed: 04/25/2015 10:13:55 AM By: Ardath Sax MD Entered By: Ardath Sax on 04/08/2015 14:07:39 JAYAN, [name] ([id number]) -------------------------------------------------------------------------------- HPI Details Patient Name: [name], [name] 04/08/2015 12:45 Date of Service: PM Medical Record [id number] Number: Patient Account Number: [id number] Date of Birth/Sex: 1940-02-26 (75 y.o. Male) Treating RN: Curtis Sites Primary Care [facility], Other Clinician: Physician: RUPASHREE Treating Christiana Pellant, Physician/Extender: Referring Physician: Armida Sans in Treatment: 1 History of Present Illness Location: both lower extremities swollen and having some fluid draining Quality: Patient reports No Pain. Severity: Patient states wound are getting worse. Duration: Patient has had the wound for > 3 months prior to seeking treatment at the wound center Context: The wound appeared gradually over time Modifying Factors: Other treatment(s) tried include:he is at a couple of courses of antibiotics and some wraps applied to his legs Associated Signs and Symptoms: Patient reports having increase swelling. 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 He was generally very healthy until about 4 years ago when the dementia set in and now has been at the [facility]. No formal workup has been done but they tried to wrap his legs which sometimes the patients takes down and has had a couple of courses of antibiotics. Not have a definite history of diabetes and was not a smoker Psychologist, prison and probation services) Signed: 04/25/2015 10:13:55 AM By: Ardath Sax MD Entered By: Ardath Sax on 04/08/2015 14:08:04 [name] ([id number]) -------------------------------------------------------------------------------- Physical Exam Details Patient Name: [name], [name] 04/08/2015 12:45 Date of Service: PM Medical Record [id number] Number: Patient Account Number: [id number] Date of Birth/Sex: 07/15/1940 (74 y.o. Male) Treating RN: Curtis Sites Primary Care [facility], Other Clinician: Physician: RUPASHREE Treating Christiana Pellant, Physician/Extender: Referring Physician: Armida Sans in Treatment: 1 Electronic Signature(s) Signed: 04/25/2015 10:13:55 AM By: Ardath Sax MD Entered By: Ardath Sax on 04/08/2015 14:08:10 [name] ([id number]) -------------------------------------------------------------------------------- Physician Orders Details Patient Name: [name], [name] 04/08/2015 12:45 Date of Service: PM Medical Record [id number] Number: Patient Account Number: [id number] Date of Birth/Sex: 05-24-1940 (75 y.o. Male) Treating RN: Curtis Sites Primary Care [name], Other Clinician: Physician: Senaida Lange, Physician/Extender: Referring Physician: Armida Sans in Treatment: 1 Verbal / Phone Orders: Yes Clinician: Curtis Sites Read Back and Verified: Yes Diagnosis Coding Wound Cleansing Wound #1 Left,Lateral Lower Leg o Cleanse wound with mild soap and water Anesthetic Wound #1 Left,Lateral Lower Leg o Topical Lidocaine 4% cream  applied to wound bed prior to debridement Primary Wound Dressing Wound #1 Left,Lateral Lower Leg o Aquacel Ag o Other: - nystatin or lotrisone cream to red areas on legs Secondary Dressing Wound #1 Left,Lateral Lower Leg o ABD pad Dressing Change Frequency Wound #1 Left,Lateral Lower Leg o Change Dressing Monday, Wednesday, Friday Follow-up Appointments Wound #1 Left,Lateral Lower Leg o Return Appointment in 1 week. Edema Control Wound #1 Left,Lateral Lower Leg o 2 Layer Compression System applied bilaterally - kerlix and coban Home Health Wound #1  Left,Lateral Lower Leg o Continue Home Health Visits Jonathan ReedyMILLER, [name] ([id number]) o Home Health Nurse may visit PRN to address patientos wound care needs. o FACE TO FACE ENCOUNTER: MEDICARE and MEDICAID PATIENTS: I certify that this patient is under my care and that I had a face-to-face encounter that meets the physician face-to-face encounter requirements with this patient on this date. The encounter with the patient was in whole or in part for the following MEDICAL CONDITION: (primary reason for Home Healthcare) MEDICAL NECESSITY: I certify, that based on my findings, NURSING services are a medically necessary home health service. HOME BOUND STATUS: I certify that my clinical findings support that this patient is homebound (i.e., Due to illness or injury, pt requires aid of supportive devices such as crutches, cane, wheelchairs, walkers, the use of special transportation or the assistance of another person to leave their place of residence. There is a normal inability to leave the home and doing so requires considerable and taxing effort. Other absences are for medical reasons / religious services and are infrequent or of short duration when for other reasons). o If current dressing causes regression in wound condition, may D/C ordered dressing product/s and apply Normal Saline Moist Dressing daily until next Wound  Healing Center / Other MD appointment. Notify Wound Healing Center of regression in wound condition at [phone]. o Please direct any NON-WOUND related issues/requests for orders to patient's Primary Care Physician Medications-please add to medication list. Wound #1 Left,Lateral Lower Leg o Other: - lortisone cream Electronic Signature(s) Signed: 04/08/2015 4:52:57 PM By: Curtis Sitesorthy, Joanna Signed: 04/25/2015 10:13:55 AM By: Ardath SaxParker, Izzabelle Bouley MD Entered By: Curtis Sitesorthy, Joanna on 04/08/2015 13:29:12 Jonathan ReedyMILLER, [name] ([id number]) -------------------------------------------------------------------------------- Problem List Details Patient Name: Jonathan ReedyMILLER, [name] 04/08/2015 12:45 Date of Service: PM Medical Record [id number] Number: Patient Account Number: [id number] Date of Birth/Sex: 10/24/1940 (74 y.o. Male) Treating RN: Curtis Sitesorthy, Joanna [facility], Other Clinician: Physician: Senaida LangeUPASHREE Treating Jenya Putz VARADARAJAN, Physician/Extender: Referring Physician: Armida SansUPASHREE Weeks in Treatment: 1 Active Problems ICD-10 Encounter Code Description Active Date Diagnosis I89.0 Lymphedema, not elsewhere classified 04/01/2015 Yes L97.221 Non-pressure chronic ulcer of left calf limited to 04/01/2015 Yes breakdown of skin L97.211 Non-pressure chronic ulcer of right calf limited to 04/01/2015 Yes breakdown of skin G30.9 Alzheimer's disease, unspecified 04/01/2015 Yes Inactive Problems Resolved Problems Electronic Signature(s) Signed: 04/25/2015 10:13:55 AM By: Ardath SaxParker, Shamra Bradeen MD Entered By: Ardath SaxParker, Ortha Metts on 04/08/2015 14:07:31 Jonathan ReedyMILLER, [name] ([id number]) -------------------------------------------------------------------------------- Progress Note Details Patient Name: Jonathan ReedyMILLER, [name] 04/08/2015 12:45 Date of Service: PM Medical Record [id number] Number: Patient Account Number: [id number] Date of Birth/Sex: 06/06/1940 [age] y.o. Male) Treating RN: Curtis Sitesorthy, [facility]anna [facility] Other  Clinician: Physician: RUPASHREE Treating Christiana PellantPARKER, Shellia Hartl VARADARAJAN, Physician/Extender: Referring Physician: Armida SansUPASHREE Weeks in Treatment: 1 Subjective Chief Complaint Information obtained from Caregiver Compression wrap change due to swelling of both lower extremities for about a year History of Present Illness (HPI) The following HPI elements were documented for the patient's wound: Location: both lower extremities swollen and having some fluid draining Quality: Patient reports No Pain. Severity: Patient states wound are getting worse. Duration: Patient has had the wound for > 3 months prior to seeking treatment at the wound center Context: The wound appeared gradually over time Modifying Factors: Other treatment(s) tried include:he is at a couple of courses of antibiotics and some wraps applied to his legs Associated Signs and Symptoms: Patient reports having increase swelling. 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 He was generally very healthy until about 4 years ago when the dementia set in and now has been at the [facility]. No formal workup has been done but they tried to wrap his legs which sometimes the patients takes down and has had a couple of courses of antibiotics. Not have a definite history of diabetes and was not a smoker Objective Constitutional Vitals Time Taken: 12:56 PM, Height: 69 in, Weight: 203 lbs, BMI: 30, Temperature: 97.9 F, Pulse: 60 bpm, Respiratory Rate: 18 breaths/min, Blood Pressure: 143/61 mmHg. Integumentary (Hair, Skin) [name], [name] ([id number]) Wound #1 status is Open. Original cause of wound was Gradually Appeared. The wound is located on the Left,Lateral Lower Leg. The wound measures 0.5cm length x 0.5cm width x 0.1cm depth; 0.196cm^2 area and 0.02cm^3 volume. The wound is limited to skin breakdown. There  is no tunneling or undermining noted. There is a medium amount of serous drainage noted. The wound margin is flat and intact. There is large (67-100%) pink granulation within the wound bed. There is no necrotic tissue within the wound bed. The periwound skin appearance did not exhibit: Callus, Crepitus, Excoriation, Fluctuance, Friable, Induration, Localized Edema, Rash, Scarring, Dry/Scaly, Maceration, Moist, Atrophie Blanche, Cyanosis, Ecchymosis, Hemosiderin Staining, Mottled, Pallor, Rubor, Erythema. Periwound temperature was noted as No Abnormality. Wound #2 status is Healed - Epithelialized. Original cause of wound was Gradually Appeared. The wound is located on the Right,Lateral Lower Leg. The wound measures 0cm length x 0cm width x 0cm depth; 0cm^2 area and 0cm^3 volume. 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 . Will Rx with collagen and compression wraps. Patient has dementia and is in nursing home. Plan Wound Cleansing: Wound #1 Left,Lateral Lower Leg: Cleanse wound with mild soap and water Anesthetic: Wound #1 Left,Lateral Lower Leg: Topical Lidocaine 4% cream applied to wound bed prior to debridement Primary Wound Dressing: Wound #1 Left,Lateral Lower Leg: Aquacel Ag Other: - nystatin or lotrisone cream to red areas on legs Secondary Dressing: Wound #1 Left,Lateral Lower Leg: [name], [name] ([id number]) ABD pad Dressing Change Frequency: Wound #1 Left,Lateral Lower Leg: Change Dressing Monday, Wednesday, Friday Follow-up Appointments: Wound #1 Left,Lateral Lower Leg: Return Appointment in 1 week. Edema Control: Wound #1 Left,Lateral Lower Leg: 2 Layer Compression System applied  bilaterally - kerlix and coban Home Health: Wound #1 Left,Lateral Lower Leg: Continue Home Health Visits Home Health Nurse may visit PRN to address patient s wound care needs. FACE TO FACE ENCOUNTER: MEDICARE and MEDICAID PATIENTS: I certify that this patient is under my care and that I had a face-to-face encounter that meets the physician face-to-face encounter requirements with this patient on this date. The encounter with the patient was in whole or in part for the following MEDICAL CONDITION: (primary reason for Home Healthcare) MEDICAL NECESSITY: I certify, that based on my findings, NURSING services are a medically necessary home health service. HOME BOUND STATUS: I certify that my clinical findings support that this patient is homebound (i.e., Due to illness or injury, pt requires aid of supportive devices such as crutches, cane, wheelchairs, walkers, the use of special transportation or the assistance of another person to leave their place of residence. There is a normal inability to leave the home and doing  so requires considerable and taxing effort. Other absences are for medical reasons / religious services and are infrequent or of short duration when for other reasons). If current dressing causes regression in wound condition, may D/C ordered dressing product/s and apply Normal Saline Moist Dressing daily until next Wound Healing Center / Other MD appointment. Notify Wound Healing Center of regression in wound condition at [phone]. Please direct any NON-WOUND related issues/requests for orders to patient's Primary Care Physician Medications-please add to medication list.: Wound #1 Left,Lateral Lower Leg: Other: - lortisone cream Follow-Up Appointments: A Patient Clinical Summary of Care was provided to [facility] Electronic Signature(s) Signed: 04/08/2015 4:01:37 PM By: Ardath Sax MD Entered By: Ardath Sax on 04/08/2015 16:01:37 [name]  ([id number]) -------------------------------------------------------------------------------- SuperBill Details Patient Name: [name] Date of Service: 04/08/2015 Medical Record Patient Account Number: [id number] Number: Treating RN: Curtis Sites Date of Birth/Sex: [birth year] (75 y.o. 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 of skin G30.9 Alzheimer's disease, unspecified Facility Procedures CPT4 Code: [id number] Description: 99213 - WOUND CARE VISIT-LEV 3 EST PT Modifier: Quantity: 1 Physician Procedures CPT4 Code Description: [id number] 69629 - WC PHYS LEVEL 3 - EST PT ICD-10 Description Diagnosis I89.0 Lymphedema, not elsewhere classified 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

## 2015-07-22 ENCOUNTER — Emergency Department: Payer: Medicare Other

## 2015-07-22 ENCOUNTER — Emergency Department
Admission: EM | Admit: 2015-07-22 | Discharge: 2015-07-22 | Disposition: A | Discharge status: Home / Self Care | Payer: Medicare Other | Attending: Emergency Medicine | Admitting: Emergency Medicine

## 2015-07-22 ENCOUNTER — Encounter: Payer: Self-pay | Admitting: Emergency Medicine

## 2015-07-22 DIAGNOSIS — Y939 Activity, unspecified: Secondary | ICD-10-CM | POA: Insufficient documentation

## 2015-07-22 DIAGNOSIS — Y929 Unspecified place or not applicable: Secondary | ICD-10-CM | POA: Insufficient documentation

## 2015-07-22 DIAGNOSIS — Y999 Unspecified external cause status: Secondary | ICD-10-CM | POA: Diagnosis not present

## 2015-07-22 DIAGNOSIS — S0001XA Abrasion of scalp, initial encounter: Secondary | ICD-10-CM | POA: Diagnosis not present

## 2015-07-22 DIAGNOSIS — W19XXXA Unspecified fall, initial encounter: Secondary | ICD-10-CM | POA: Insufficient documentation

## 2015-07-22 DIAGNOSIS — J449 Chronic obstructive pulmonary disease, unspecified: Secondary | ICD-10-CM | POA: Insufficient documentation

## 2015-07-22 DIAGNOSIS — S0990XA Unspecified injury of head, initial encounter: Secondary | ICD-10-CM | POA: Diagnosis present

## 2015-07-22 HISTORY — DX: Chronic obstructive pulmonary disease, unspecified: J44.9

## 2015-07-22 HISTORY — DX: Cellulitis, unspecified: L03.90

## 2015-07-22 HISTORY — DX: Unspecified dementia, unspecified severity, without behavioral disturbance, psychotic disturbance, mood disturbance, and anxiety: F03.90

## 2015-07-22 HISTORY — DX: Tremor, unspecified: R25.1

## 2015-07-22 NOTE — ED Notes (Signed)
Patient transported to CT 

## 2015-07-22 NOTE — ED Notes (Signed)
Per [facility], patient comes from [facility]. Hx of dementia, tremors, COPD, and cellulitis to right left (currently being treated with antibiotics). Patient had an unwitnessed fall today. Was found outside near a patio area. Patients orientation is at baseline per [facility] staff. Patient c/o neck pain. C collar in place. Small abrasion noted to medial/top of forehead. Patient is a DNR. VSS.

## 2015-07-22 NOTE — ED Provider Notes (Signed)
[facility] Emergency Department Provider Note   ____________________________________________  Time seen: Approximately 11:31 AM  I have reviewed the triage vital signs and the nursing notes.   HISTORY  Chief Complaint Fall    HPI [name] is a 75 y.o. 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 He was complaining to the EMS crew of neck pain and was placed in cervical collar. He has no complaints at this time. I discussed the case with his wife, Ms. Whaling, who says that he has been having falls recently but had some medication changes that helped him with his walking. She says that he is also undergoing therapy to help him with his ambulation at his skilled nursing facility. The patient is at his baseline mental status per EMS.   Past Medical History  Diagnosis Date  . COPD (chronic obstructive pulmonary disease) (HCC)   . Dementia   . Tremors of nervous system   . Cellulitis     Right leg    Patient Active Problem List   Diagnosis Date Noted  . 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  No current outpatient prescriptions on file.  Allergies Review of patient's allergies indicates not on file.  No family history on file.  Social History Social History  Substance Use Topics  . Smoking status: None  . Smokeless tobacco: None  . Alcohol Use: None    Review of Systems Constitutional: No fever/chills Eyes: No visual changes. ENT: No sore throat. Cardiovascular: Denies chest pain. Respiratory: Denies shortness of breath. Gastrointestinal: No abdominal pain.  No nausea, no vomiting.  No diarrhea.  No constipation. Genitourinary: Negative for dysuria. Musculoskeletal: Negative for back pain. Skin: Negative for rash. Neurological: Negative for headaches, focal weakness  or numbness.  10-point ROS otherwise negative.  ____________________________________________   PHYSICAL EXAM:  VITAL SIGNS: ED Triage Vitals  Enc Vitals Group     BP 07/22/15 1121 120/59 mmHg     Pulse Rate 07/22/15 1121 64     Resp 07/22/15 1121 20     Temp 07/22/15 1121 98.1 F (36.7 C)     Temp Source 07/22/15 1121 Oral     SpO2 07/22/15 1121 98 %     Weight 07/22/15 1121 175 lb (79.379 kg)     Height 07/22/15 1121 5\' 9"  (1.753 m)     Head Cir --      Peak Flow --      Pain Score --      Pain Loc --      Pain Edu? --      Excl. in GC? --     Constitutional: Alert and orientedTo self. Well appearing and in no acute distress. Eyes: Conjunctivae are normal. PERRL. EOMI. Head: Frontal forehead abrasion measuring about 4 x 5 cm and superficial. No active bleeding. Nose: No congestion/rhinnorhea. Mouth/Throat: Mucous membranes are moist.   Neck: No stridor.  Wearing cervical collar. No tenderness to the C-spine through the cervical collar. Cardiovascular: Normal rate, regular rhythm. Grossly normal heart sounds.  Good peripheral circulation. Respiratory: Normal respiratory effort.  No retractions. Lungs CTAB. Gastrointestinal: Soft and nontender. No distention.  No CVA tenderness. Musculoskeletal: Right lower extremity edema and erythema to the leg. No tenderness palpation. Slight warmth. His wife says that this has been stable over  the past several months and that he is scheduled to see an infectious disease doctor. Neurologic:  Normal speech and language. No gross focal neurologic deficits are appreciated.  Skin:  Skin is warm, dry and intact. No rash noted. Psychiatric: Mood and affect are normal. Speech and behavior are normal.  ____________________________________________   LABS (all labs ordered are listed, but only abnormal results are displayed)  Labs Reviewed - No data to  display ____________________________________________  EKG   ____________________________________________  RADIOLOGY  CT Head Wo Contrast (Final result) Result time: 07/22/15 12:20:45   Final result by Rad Results In Interface (07/22/15 12:20:45)   Narrative:   CLINICAL DATA: Unwitnessed fall.  EXAM: CT HEAD WITHOUT CONTRAST  CT CERVICAL SPINE WITHOUT CONTRAST  TECHNIQUE: Multidetector CT imaging of the head and cervical spine was performed following the standard protocol without intravenous contrast. Multiplanar CT image reconstructions of the cervical spine were also generated.  COMPARISON: None.  FINDINGS: CT HEAD FINDINGS  Prominence of the sulci and ventricles are identified compatible with brain atrophy. No abnormal extra-axial fluid collections, intracranial hemorrhage or mass identified. No evidence for acute brain infarct. The paranasal sinuses scratch set there is mild mucosal thickening involving the right frontal sinus. The mastoid air cells appear clear. The calvarium is intact.  CT CERVICAL SPINE FINDINGS  The normal alignment of the cervical spine. The vertebral body heights and disc spaces are well preserved. Bilateral facet degenerative change is identified. The prevertebral soft tissue space appears normal. There is no fracture or subluxation identified.  IMPRESSION: 1. No acute intracranial abnormalities. 2. Brain atrophy. 3. Normal appearance of the cervical spine.   Electronically Signed By: Signa Kell M.D. On: 07/22/2015 12:20          CT Cervical Spine Wo Contrast (Final result) Result time: 07/22/15 12:20:45   Final result by Rad Results In Interface (07/22/15 12:20:45)   Narrative:   CLINICAL DATA: Unwitnessed fall.  EXAM: CT HEAD WITHOUT CONTRAST  CT CERVICAL SPINE WITHOUT CONTRAST  TECHNIQUE: Multidetector CT imaging of the head and cervical spine was performed following the standard protocol without  intravenous contrast. Multiplanar CT image reconstructions of the cervical spine were also generated.  COMPARISON: None.  FINDINGS: CT HEAD FINDINGS  Prominence of the sulci and ventricles are identified compatible with brain atrophy. No abnormal extra-axial fluid collections, intracranial hemorrhage or mass identified. No evidence for acute brain infarct. The paranasal sinuses scratch set there is mild mucosal thickening involving the right frontal sinus. The mastoid air cells appear clear. The calvarium is intact.  CT CERVICAL SPINE FINDINGS  The normal alignment of the cervical spine. The vertebral body heights and disc spaces are well preserved. Bilateral facet degenerative change is identified. The prevertebral soft tissue space appears normal. There is no fracture or subluxation identified.  IMPRESSION: 1. No acute intracranial abnormalities. 2. Brain atrophy. 3. Normal appearance of the cervical spine.   Electronically Signed By: Signa Kell M.D. On: 07/22/2015 12:20    ____________________________________________   PROCEDURES    ____________________________________________   INITIAL IMPRESSION / ASSESSMENT AND PLAN / ED COURSE  Pertinent labs & imaging results that were available during my care of the patient were reviewed by me and considered in my medical decision making (see chart for details).  Per the patient's wife he has had multiple recent falls. I discussed pursuing further workup including EKG as well as urine and we agreed that this would be too aggressive at this time for this patient. We will make  sure that he has no acute injuries. CAT scan of his head and cervical spine are ordered.  ----------------------------------------- 12:57 PM on 07/22/2015 -----------------------------------------  Patient's wife is not the bedside. Alerted about no acute findings on the CAT scan of the head and C-spine. She is concerned about the  patient's right lower extremity and says the redness is worse but says she has not seen her recently and the patient is on antibiotics. She says that is being cared for at the skilled nursing facility and that he is supposed to be following up with and an infectious disease doctor. We discussed treatment at this time and said that since it is being handled as an outpatient and the patient is not febrile or having any altered mental status that we would continue with the outpatient treatment and that the patient to return if any worsening or concerning symptoms occur. We discussed that usually the skilled nursing facility is very quick to send him to the emergency department for worsening symptoms such as infection leg sialitis. Wife is agreeable with this plan. She also says the patient is up-to-date with his immunizations. Cervical collar was removed. Patient ranging head without any distress or outward signs of pain. ____________________________________________   FINAL CLINICAL IMPRESSION(S) / ED DIAGNOSES  Fall. Abrasion to scalp.  NEW MEDICATIONS STARTED DURING THIS VISIT:  New Prescriptions   No medications on file     Note:  This document was prepared using Dragon voice recognition software and may include unintentional dictation errors.    Myrna Blazeravid Matthew Ephrata [place], MD 07/22/15 [phone]

## 2015-07-22 NOTE — Discharge Instructions (Signed)
Abrasion An abrasion is a cut or scrape on the outer surface of your skin. An abrasion does not extend through all of the layers of your skin. It is important to care for your abrasion properly to prevent infection. CAUSES Most abrasions are caused by falling on or gliding across the ground or another surface. When your skin rubs on something, the outer and inner layer of skin rubs off.  SYMPTOMS A cut or scrape is the main symptom of this condition. The scrape may be bleeding, or it may appear red or pink. If there was an associated fall, there may be an underlying bruise. DIAGNOSIS An abrasion is diagnosed with a physical exam. TREATMENT Treatment for this condition depends on how large and deep the abrasion is. Usually, your abrasion will be cleaned with water and mild soap. This removes any dirt or debris that may be stuck. An antibiotic ointment may be applied to the abrasion to help prevent infection. A bandage (dressing) may be placed on the abrasion to keep it clean. You may also need a tetanus shot. HOME CARE INSTRUCTIONS Medicines  Take or apply medicines only as directed by your health care provider.  If you were prescribed an antibiotic ointment, finish all of it even if you start to feel better. Wound Care  Clean the wound with mild soap and water 2-3 times per day or as directed by your health care provider. Pat your wound dry with a clean towel. Do not rub it.  There are many different ways to close and cover a wound. Follow instructions from your health care provider about:  Wound care.  Dressing changes and removal.  Check your wound every day for signs of infection. Watch for:  Redness, swelling, or pain.  Fluid, blood, or pus. General Instructions  Keep the dressing dry as directed by your health care provider. Do not take baths, swim, use a hot tub, or do anything that would put your wound underwater until your health care provider approves.  If there is  swelling, raise (elevate) the injured area above the level of your heart while you are sitting or lying down.  Keep all follow-up visits as directed by your health care provider. This is important. SEEK MEDICAL CARE IF:  You received a tetanus shot and you have swelling, severe pain, redness, or bleeding at the injection site.  Your pain is not controlled with medicine.  You have increased redness, swelling, or pain at the site of your wound. SEEK IMMEDIATE MEDICAL CARE IF:  You have a red streak going away from your wound.  You have a fever.  You have fluid, blood, or pus coming from your wound.  You notice a bad smell coming from your wound or your dressing.   This information is not intended to replace advice given to you by your health care provider. Make sure you discuss any questions you have with your health care provider.   Document Released: 11/15/2004 Document Revised: 10/27/2014 Document Reviewed: 02/03/2014 Elsevier Interactive Patient Education 2016 [employer]  

## 2015-10-21 ENCOUNTER — Emergency Department: Payer: Medicare Other

## 2015-10-21 ENCOUNTER — Emergency Department
Admission: EM | Admit: 2015-10-21 | Discharge: 2015-10-21 | Disposition: A | Discharge status: Home / Self Care | Payer: Medicare Other | Attending: Emergency Medicine | Admitting: Emergency Medicine

## 2015-10-21 DIAGNOSIS — W19XXXA Unspecified fall, initial encounter: Secondary | ICD-10-CM

## 2015-10-21 DIAGNOSIS — J449 Chronic obstructive pulmonary disease, unspecified: Secondary | ICD-10-CM | POA: Diagnosis not present

## 2015-10-21 DIAGNOSIS — S0081XA Abrasion of other part of head, initial encounter: Secondary | ICD-10-CM | POA: Insufficient documentation

## 2015-10-21 DIAGNOSIS — Y999 Unspecified external cause status: Secondary | ICD-10-CM | POA: Insufficient documentation

## 2015-10-21 DIAGNOSIS — Y929 Unspecified place or not applicable: Secondary | ICD-10-CM | POA: Insufficient documentation

## 2015-10-21 DIAGNOSIS — W01198A Fall on same level from slipping, tripping and stumbling with subsequent striking against other object, initial encounter: Secondary | ICD-10-CM | POA: Insufficient documentation

## 2015-10-21 DIAGNOSIS — Y939 Activity, unspecified: Secondary | ICD-10-CM | POA: Insufficient documentation

## 2015-10-21 DIAGNOSIS — S0990XA Unspecified injury of head, initial encounter: Secondary | ICD-10-CM

## 2015-10-21 NOTE — ED Notes (Signed)
Patient returned to ED 6 from radiology at this time. Patient conscious and alert; no changes in mental status as compared to when patient initially presented tonight. No new orders in [facility]. Patient awaiting radiology results.

## 2015-10-21 NOTE — ED Notes (Signed)
Patient to radiology at this time for MD ordered studies.  

## 2015-10-21 NOTE — ED Triage Notes (Signed)
Patient presents to the ED via EMS from [facility]. Patient sustained a fall just PTA EMS reports that patient tripped over a door frame. (+) abrasions noted LEFT frontal scalp area; bleeding controlled. No other obvious injuries noted at this time.

## 2015-10-21 NOTE — ED Provider Notes (Signed)
[facility] Emergency Department Provider Note  Time seen: 8:02 PM  I have reviewed the triage vital signs and the nursing notes.   HISTORY  Chief Complaint Fall and Head Injury    HPI Jonathan ReedyJohn Valenzuela is a 75 y.o. male with a past medical history of dementia who presents the emergency department from his nursing facility after a fall. According to EMS report the patient tripped over a door frame falling forward hitting his head on the ground. No reported LOC. Patient has dementia and cannot contribute to his history. Does not appear to be in any distress.  Past Medical History:  Diagnosis Date  . Cellulitis    Right leg  . COPD (chronic obstructive pulmonary disease) (HCC)   . Dementia   . Tremors of nervous system     Patient Active Problem List   Diagnosis Date Noted  . 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  Prior to Admission medications   Not on File    Allergies not on file  No family history on file.  Social History Social History  Substance Use Topics  . Smoking status: Not on file  . Smokeless tobacco: Not on file  . Alcohol use Not on file    Review of Systems Unable to obtain a review of systems due to significant dementia.  ____________________________________________   PHYSICAL EXAM:  Constitutional: Alert. Looking around. Does not follow commands or answer questions. History of significant dementia per EMS report. Eyes: Normal exam ENT   Head: Moderate abrasion to the patient's forehead.   Nose: No congestion/rhinnorhea. Cardiovascular: Normal rate, regular rhythm.  Respiratory: Normal respiratory effort without tachypnea nor retractions. Breath sounds are clear and equal bilaterally. Gastrointestinal: Soft and nontender. No distention.  No reaction to abdominal palpation. Musculoskeletal: Nontender with normal range of motion in all  extremities. Good range of motion in all extremities, largely atraumatic appearing. Stable pelvis. Neurologic:  Patient awake, alert, moves all extremities. Skin:  Skin is warm, dry. Moderate abrasion to forehead. No laceration.  ____________________________________________        RADIOLOGY  X-rays CT scans are negative.  ____________________________________________   INITIAL IMPRESSION / ASSESSMENT AND PLAN / ED COURSE  Pertinent labs & imaging results that were available during my care of the patient were reviewed by me and considered in my medical decision making (see chart for details).  The patient presents emergency department after a fall. Patient has significant dementia and cannot contribute to his history. Overall the patient appears well he does have moderate abrasion to the forehead, but no other signs of trauma on exam. We will obtain a CT scan of the patient's head and neck as well as a 1 view pelvis x-ray.  X-rays CT scans are negative. We will discharge patient back to his nursing facility. ____________________________________________   FINAL CLINICAL IMPRESSION(S) / ED DIAGNOSES  Jonathan RossFall    Jonathan Portillo, MD 10/21/15 2121

## 2015-10-21 NOTE — ED Notes (Signed)

## 2015-12-19 ENCOUNTER — Emergency Department: Payer: Medicare Other

## 2015-12-19 ENCOUNTER — Emergency Department
Admission: EM | Admit: 2015-12-19 | Discharge: 2015-12-19 | Disposition: A | Discharge status: Home / Self Care | Payer: Medicare Other | Attending: Emergency Medicine | Admitting: Emergency Medicine

## 2015-12-19 ENCOUNTER — Encounter: Payer: Self-pay | Admitting: Emergency Medicine

## 2015-12-19 DIAGNOSIS — S0003XA Contusion of scalp, initial encounter: Secondary | ICD-10-CM | POA: Insufficient documentation

## 2015-12-19 DIAGNOSIS — J449 Chronic obstructive pulmonary disease, unspecified: Secondary | ICD-10-CM | POA: Insufficient documentation

## 2015-12-19 DIAGNOSIS — Y999 Unspecified external cause status: Secondary | ICD-10-CM | POA: Diagnosis not present

## 2015-12-19 DIAGNOSIS — Y92129 Unspecified place in nursing home as the place of occurrence of the external cause: Secondary | ICD-10-CM | POA: Insufficient documentation

## 2015-12-19 DIAGNOSIS — W19XXXA Unspecified fall, initial encounter: Secondary | ICD-10-CM | POA: Insufficient documentation

## 2015-12-19 DIAGNOSIS — S0990XA Unspecified injury of head, initial encounter: Secondary | ICD-10-CM | POA: Diagnosis present

## 2015-12-19 DIAGNOSIS — Y939 Activity, unspecified: Secondary | ICD-10-CM | POA: Diagnosis not present

## 2015-12-19 NOTE — ED Notes (Signed)
Sitting 1:1 with pt at this time.  

## 2015-12-19 NOTE — Discharge Instructions (Signed)
Take tylenol 500 mg every 4 hrs as needed for pain.   Fall precautions.  See your doctor   Return to ER if you have severe headache, vomiting, fall.

## 2015-12-19 NOTE — ED Triage Notes (Addendum)
[facility] for [facility] unwitness[facility] fall with small abrasion on left side of head. Pt is nonverbal. Found on all 4's crawling away from where he fell.

## 2015-12-19 NOTE — ED Provider Notes (Signed)
[facility]-EMERGENCY DEPARTMENT Provider Note   CSN: [id number] Arrival date & time: 12/19/15  2111     History   Chief Complaint Chief Complaint  Patient presents with  . Fall    HPI [name] is a 75 y.o. male hx of dementia, COPD, from nursing home here with fall. Patient was found on the floor crawling. He was noted to have a small abrasion on the left side of his scalp. Patient did not remember how he fell. He is from a nursing home and has frequent falls and most recently was seen about 2 months ago for a fall. He is only on aspirin currently  The history is provided by the patient.   Level V caveat- dementia   Past Medical History:  Diagnosis Date  . Cellulitis    Right leg  . COPD (chronic obstructive pulmonary disease) (HCC)   . Dementia   . Tremors of nervous system     Patient Active Problem List   Diagnosis Date Noted  . 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 No pertinent surgical history.     Home Medications    Prior to Admission medications   Not on File    Family History History reviewed. No pertinent family history.  Social History Social History  Substance Use Topics  . Smoking status: Unknown If Ever Smoked  . Smokeless tobacco: Not on file  . Alcohol use Not on file     Allergies   Penicillins   Review of Systems Review of Systems  Skin: Positive for color change.  Neurological: Positive for tremors.  All other systems reviewed and are negative.    Physical Exam Updated Vital Signs BP (!) 122/44   Pulse (!) 118   Resp 18   Ht 5\' 10"  (1.778 m)   SpO2 100%   Physical Exam  Constitutional:  Demented, NAD, baseline tremors   HENT:  Head: Normocephalic.  Small scalp hematoma L scalp   Eyes: EOM are normal. Pupils are equal, round, and reactive to light.  Neck: Normal range of motion. Neck supple.  Cardiovascular: Normal rate, regular rhythm and normal heart  sounds.   Pulmonary/Chest: Effort normal and breath sounds normal. No respiratory distress. He has no wheezes. He has no rales.  Abdominal: Soft. Bowel sounds are normal. He exhibits no distension. There is no tenderness.  Musculoskeletal: Normal range of motion.  Pelvis stable, nl ROM bilateral hips. No obvious spinal tenderness   Neurological: He is alert.  Demented. Moving all extremities   Skin: Skin is warm.  Psychiatric: He has a normal mood and affect.  Nursing note and vitals reviewed.    ED Treatments / Results  Labs (all labs ordered are listed, but only abnormal results are displayed) Labs Reviewed - No data to display  EKG  EKG Interpretation None      ED ECG REPORT I, Richardean Canal, the attending physician, personally viewed and interpreted this ECG.   Date: 12/19/2015  EKG Time: 21:22 pm  Rate: 67  Rhythm: normal EKG, normal sinus rhythm  Axis: left  Intervals:none  ST&T Change: none   Radiology Dg Chest 1 View  Result Date: 12/19/2015 CLINICAL DATA:  Unwitnessed fall. Nonverbal patient, history of dementia. EXAM: CHEST 1 VIEW COMPARISON:  None. FINDINGS: Cardiomediastinal silhouette is unremarkable for this low inspiratory examination with crowded mildly engorged vasculature markings. The lungs are clear without pleural effusions or focal  consolidations. Trachea projects midline and there is no pneumothorax. Included soft tissue planes and osseous structures are non-suspicious. IMPRESSION: Mild pulmonary vascular congestion in this low inspiratory portable examination. Electronically Signed   By: Awilda Metroourtnay  Bloomer M.D.   On: 12/19/2015 22:24   Dg Pelvis 1-2 Views  Result Date: 12/19/2015 CLINICAL DATA:  Unwitnessed fall. Nonverbal patient, history of dementia. EXAM: PELVIS - 1-2 VIEW COMPARISON:  Abdominal radiograph October 20, 2015 FINDINGS: There is no evidence of pelvic fracture or diastasis. No pelvic bone lesions are seen. IMPRESSION: Negative.  Electronically Signed   By: Awilda Metroourtnay  Bloomer M.D.   On: 12/19/2015 22:25   Ct Head Wo Contrast  Result Date: 12/19/2015 CLINICAL DATA:  Pain following fall EXAM: CT HEAD WITHOUT CONTRAST CT CERVICAL SPINE WITHOUT CONTRAST TECHNIQUE: Multidetector CT imaging of the head and cervical spine was performed following the standard protocol without intravenous contrast. Multiplanar CT image reconstructions of the cervical spine were also generated. COMPARISON:  CT head and CT cervical spine October 21, 2015 FINDINGS: CT HEAD FINDINGS Brain: Moderate diffuse atrophy is stable. The atrium of the right lateral ventricle is slightly larger than the atrium the left lateral ventricle, a probable anatomic variant. There is no intracranial mass, hemorrhage, extra-axial fluid collection, or midline shift. Mild small vessel disease in the centra semiovale bilaterally is stable. There is no new gray-white compartment lesion. No acute infarct is evident. Vascular: There is no hyperdense vessel. There is no appreciable vascular calcification. Skull: The bony calvarium appears intact. Sinuses/Orbits: There is opacification in several ethmoid air cells bilaterally. Other visualized paranasal sinuses are clear. Orbits appear symmetric bilaterally. Other: Mastoid air cells are clear. CT CERVICAL SPINE FINDINGS Alignment: There is no evidence spondylolisthesis. Skull base and vertebrae: The craniocervical junction and skull base regions appear unremarkable. There is no evident fracture. There are no blastic or lytic bone lesions. Soft tissues and spinal canal: Prevertebral soft tissues and predental space regions are normal. There is no appreciable paraspinous lesions. No spinal stenosis is evident. Disc levels: There is disc space narrowing, mild, at all levels. There is slightly greater disc space narrowing at C6-7 and C7-T1. There is facet hypertrophy at multiple levels bilaterally. There is exit foraminal narrowing due to facet  hypertrophy at C3-4 bilaterally, C4-5 on the left, and C6-7 on the left. There is no frank disc extrusion evident. Upper chest: Visualized lung apices are clear. Other: None IMPRESSION: CT head: Stable atrophy with mild periventricular small vessel disease. No intracranial mass, hemorrhage, or extra-axial fluid collection. No acute appearing infarct. There are foci of paranasal sinus disease in several ethmoid air cells bilaterally. CT cervical spine: No fracture or spondylolisthesis. There is osteoarthritic change at several levels. Electronically Signed   By: Bretta BangWilliam  Woodruff III M.D.   On: 12/19/2015 22:00   Ct Cervical Spine Wo Contrast  Result Date: 12/19/2015 CLINICAL DATA:  Pain following fall EXAM: CT HEAD WITHOUT CONTRAST CT CERVICAL SPINE WITHOUT CONTRAST TECHNIQUE: Multidetector CT imaging of the head and cervical spine was performed following the standard protocol without intravenous contrast. Multiplanar CT image reconstructions of the cervical spine were also generated. COMPARISON:  CT head and CT cervical spine October 21, 2015 FINDINGS: CT HEAD FINDINGS Brain: Moderate diffuse atrophy is stable. The atrium of the right lateral ventricle is slightly larger than the atrium the left lateral ventricle, a probable anatomic variant. There is no intracranial mass, hemorrhage, extra-axial fluid collection, or midline shift. Mild small vessel disease in the centra semiovale bilaterally is  stable. There is no new gray-white compartment lesion. No acute infarct is evident. Vascular: There is no hyperdense vessel. There is no appreciable vascular calcification. Skull: The bony calvarium appears intact. Sinuses/Orbits: There is opacification in several ethmoid air cells bilaterally. Other visualized paranasal sinuses are clear. Orbits appear symmetric bilaterally. Other: Mastoid air cells are clear. CT CERVICAL SPINE FINDINGS Alignment: There is no evidence spondylolisthesis. Skull base and vertebrae: The  craniocervical junction and skull base regions appear unremarkable. There is no evident fracture. There are no blastic or lytic bone lesions. Soft tissues and spinal canal: Prevertebral soft tissues and predental space regions are normal. There is no appreciable paraspinous lesions. No spinal stenosis is evident. Disc levels: There is disc space narrowing, mild, at all levels. There is slightly greater disc space narrowing at C6-7 and C7-T1. There is facet hypertrophy at multiple levels bilaterally. There is exit foraminal narrowing due to facet hypertrophy at C3-4 bilaterally, C4-5 on the left, and C6-7 on the left. There is no frank disc extrusion evident. Upper chest: Visualized lung apices are clear. Other: None IMPRESSION: CT head: Stable atrophy with mild periventricular small vessel disease. No intracranial mass, hemorrhage, or extra-axial fluid collection. No acute appearing infarct. There are foci of paranasal sinus disease in several ethmoid air cells bilaterally. CT cervical spine: No fracture or spondylolisthesis. There is osteoarthritic change at several levels. Electronically Signed   By: Bretta BangWilliam  Woodruff III M.D.   On: 12/19/2015 22:00    Procedures Procedures (including critical care time)  Medications Ordered in ED Medications - No data to display   Initial Impression / Assessment and Plan / ED Course  I have reviewed the triage vital signs and the nursing notes.  Pertinent labs & imaging results that were available during my care of the patient were reviewed by me and considered in my medical decision making (see chart for details).  Clinical Course    [name] is a 75 y.o. male here with fall. Nonverbal at baseline. Moving all extremities. Small L scalp hematoma. Was noted to be tachycardic with HR 118 but has tremors and HR was 67 on the EKG. Will get CT head/neck, xrays. Will not do syncope workup.  10:33 PM CT head, xrays unremarkable. Will dc back to facility.     Final Clinical Impressions(s) / ED Diagnoses   Final diagnoses:  None    New Prescriptions New Prescriptions   No medications on file     Charlynne Panderavid Hsienta Haillee Johann, MD 12/19/15 2233

## 2015-12-19 NOTE — ED Notes (Signed)
PT [facility]

## 2016-01-08 ENCOUNTER — Encounter: Payer: Self-pay | Admitting: Emergency Medicine

## 2016-01-08 ENCOUNTER — Emergency Department: Payer: Medicare Other

## 2016-01-08 ENCOUNTER — Emergency Department
Admission: EM | Admit: 2016-01-08 | Discharge: 2016-01-08 | Disposition: A | Discharge status: Home / Self Care | Payer: Medicare Other | Attending: Emergency Medicine | Admitting: Emergency Medicine

## 2016-01-08 DIAGNOSIS — W19XXXA Unspecified fall, initial encounter: Secondary | ICD-10-CM | POA: Insufficient documentation

## 2016-01-08 DIAGNOSIS — Y92129 Unspecified place in nursing home as the place of occurrence of the external cause: Secondary | ICD-10-CM | POA: Diagnosis not present

## 2016-01-08 DIAGNOSIS — F0390 Unspecified dementia without behavioral disturbance: Secondary | ICD-10-CM | POA: Insufficient documentation

## 2016-01-08 DIAGNOSIS — S0990XA Unspecified injury of head, initial encounter: Secondary | ICD-10-CM | POA: Diagnosis present

## 2016-01-08 DIAGNOSIS — J449 Chronic obstructive pulmonary disease, unspecified: Secondary | ICD-10-CM | POA: Diagnosis not present

## 2016-01-08 DIAGNOSIS — S0001XA Abrasion of scalp, initial encounter: Secondary | ICD-10-CM | POA: Insufficient documentation

## 2016-01-08 DIAGNOSIS — Y939 Activity, unspecified: Secondary | ICD-10-CM | POA: Insufficient documentation

## 2016-01-08 DIAGNOSIS — Y999 Unspecified external cause status: Secondary | ICD-10-CM | POA: Diagnosis not present

## 2016-01-08 HISTORY — DX: Benign prostatic hyperplasia without lower urinary tract symptoms: N40.0

## 2016-01-08 LAB — BASIC METABOLIC PANEL
ANION GAP: 8 (ref 5–15)
BUN: 21 mg/dL — ABNORMAL HIGH (ref 6–20)
CALCIUM: 8.6 mg/dL — AB (ref 8.9–10.3)
CO2: 25 mmol/L (ref 22–32)
Chloride: 105 mmol/L (ref 101–111)
Creatinine, Ser: 0.93 mg/dL (ref 0.61–1.24)
GLUCOSE: 89 mg/dL (ref 65–99)
POTASSIUM: 3.9 mmol/L (ref 3.5–5.1)
Sodium: 138 mmol/L (ref 135–145)

## 2016-01-08 LAB — CBC
HEMATOCRIT: 40.8 % (ref 40.0–52.0)
Hemoglobin: 14.2 g/dL (ref 13.0–18.0)
MCH: 33.5 pg (ref 26.0–34.0)
MCHC: 34.9 g/dL (ref 32.0–36.0)
MCV: 96.2 fL (ref 80.0–100.0)
Platelets: 247 10*3/uL (ref 150–440)
RBC: 4.25 MIL/uL — AB (ref 4.40–5.90)
RDW: 12.9 % (ref 11.5–14.5)
WBC: 5.8 10*3/uL (ref 3.8–10.6)

## 2016-01-08 LAB — CK: Total CK: 73 U/L (ref 49–397)

## 2016-01-08 NOTE — ED Triage Notes (Signed)
Pt arrived via EMS from [facility]. Pt was found outside for an unknown period of time.  Pt found on the ground with abrasion to his head. Pt is non-verbal at this time, with essential tremor present. Per EMS, staff advised them that they are unsure how long the patient was outside only told them how they just found him outside. Per staff, pt is at his baseline mental status.

## 2016-01-08 NOTE — ED Provider Notes (Signed)
[facility] Emergency Department Provider Note  Time seen: 4:35 PM  I have reviewed the triage vital signs and the nursing notes.   HISTORY  Chief Complaint Cold Exposure and Fall    HPI [name] is a 75 y.o. male the patient presents to the emergency department after a fall, unknown downtime. [facility] was found outside the facility it is not clear how long the patient was outside for. Patient has a history of dementia and resting tremor. Per staff at patient is at his baseline. Per protocol they sent him into the emergency department for evaluation. Normal oral temperature upon arrival to the emergency department. Patient is unable to contribute to his history, per EMS report this is the patient's baseline.   Past Medical History:  Diagnosis Date  . Cellulitis    Right leg  . COPD (chronic obstructive pulmonary disease) (HCC)   . Dementia   . Tremors of nervous system     Patient Active Problem List   Diagnosis Date Noted  . 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  Prior to Admission medications   Not on File    Allergies  Allergen Reactions  . Penicillins     No family history on file.  Social History Social History  Substance Use Topics  . Smoking status: Unknown If Ever Smoked  . Smokeless tobacco: Not on file  . Alcohol use Not on file    Review of Systems Unable to obtain a review of systems due to significant dementia.  ____________________________________________   PHYSICAL EXAM:  VITAL SIGNS: ED Triage Vitals [01/08/16 1634]  Enc Vitals Group     BP (!) 154/98     Pulse      Resp 14     Temp 97.9 F (36.6 C)     Temp Source Oral     SpO2      Weight      Height      Head Circumference      Peak Flow      Pain Score      Pain Loc      Pain Edu?      Excl. in GC?     Constitutional:Alert, looks  around the room, makes eye contact. Patient does not answer questions, per EMS per nursing home staff this is the patient's baseline.  Eyes: Normal exam ENT   Head:Patient does have a small abrasion to the left occipital scalp   Mouth/Throat: Mucous membranes are moist. Cardiovascular: Normal rate, regular rhythm Respiratory: Normal respiratory effort without tachypnea nor retractions. Breath sounds are clear  Gastrointestinal: Soft and nontender. No distention.  Musculoskeletal: Nontender with normal range of motion in all extremities.Good range of motion in all extremities without apparent pain.  Neurologic:  Normal speech and language. No gross focal neurologic deficits  Skin:  Skin is warm, dry and intact.  Psychiatric: Mood and affect are normal.  ____________________________________________   RADIOLOGY  ct head negative  ____________________________________________   INITIAL IMPRESSION / ASSESSMENT AND PLAN / ED COURSE  Pertinent labs & imaging results that were available during my care of the patient were reviewed by me and considered in my medical decision making (see chart for details).  Patient presents from the nursing facility after being found on the ground outside, presumed fall unknown down time. We will check labs including a  CK. Obtain a CT scan of the head given the patient has a left occipital abrasion. Otherwise the patient's exam appears normal. Per EMS report and record review patient appears to be at his baseline. If labs and imaging are within normal limits patient will be discharged back to his nursing facility.   Labs and CT head are negative. Per report patient had baseline. We'll discharge home at this time.   ____________________________________________   FINAL CLINICAL IMPRESSION(S) / ED DIAGNOSES  Thresa RossFall    Shawntay Prest, MD 01/08/16 1745

## 2016-01-08 NOTE — ED Notes (Signed)
Pt was able to stand and take a few steps with minimal assistance. [facility]. Pt has been able to speak some stating that he was cold and answering yes or no.

## 2016-01-08 NOTE — ED Notes (Signed)
Patient transported to CT 

## 2016-11-13 ENCOUNTER — Other Ambulatory Visit (HOSPITAL_COMMUNITY): Payer: Self-pay | Admitting: Respiratory Therapy

## 2016-11-13 DIAGNOSIS — M340 Progressive systemic sclerosis: Secondary | ICD-10-CM

## 2016-11-16 ENCOUNTER — Other Ambulatory Visit (HOSPITAL_COMMUNITY): Payer: Self-pay | Admitting: Respiratory Therapy

## 2017-02-01 IMAGING — CT CT HEAD W/O CM
3 series · 15 of 45 positions shown, 18 images · non-contrast
Comparison: Head CT scan 12/19/2015.

CLINICAL DATA: Patient found down today with an abrasion on the
head.

EXAM:
CT HEAD WITHOUT CONTRAST
TECHNIQUE: Contiguous axial images were obtained from the base of the skull
through the vertex without intravenous contrast.

[Series 3: ax head wo · axial · 0.41mm/px · z∈[-153,-38]mm · 9 of 28 slices shown, 12 images]
[im 3/28  brain]
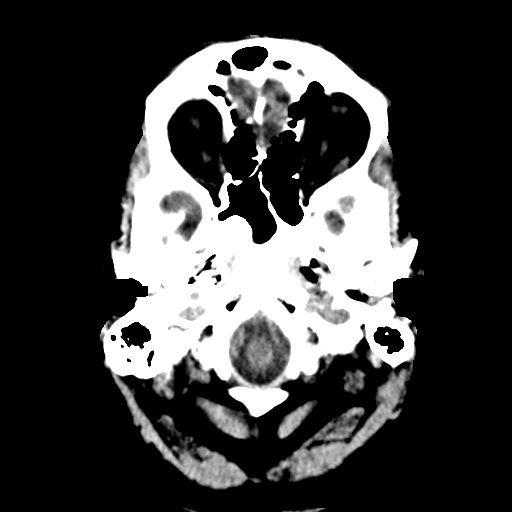
[im 3/28  bone]
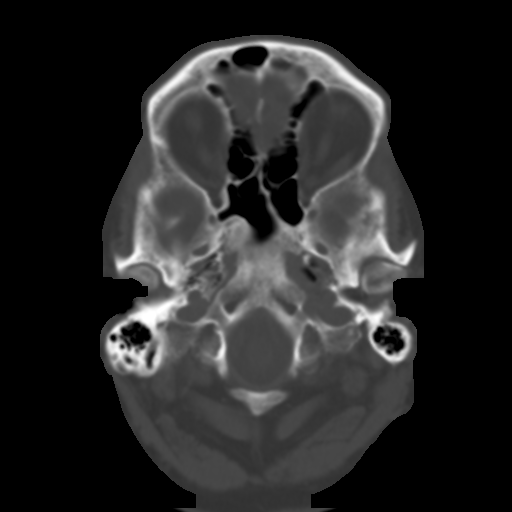
[im 6/28  brain]
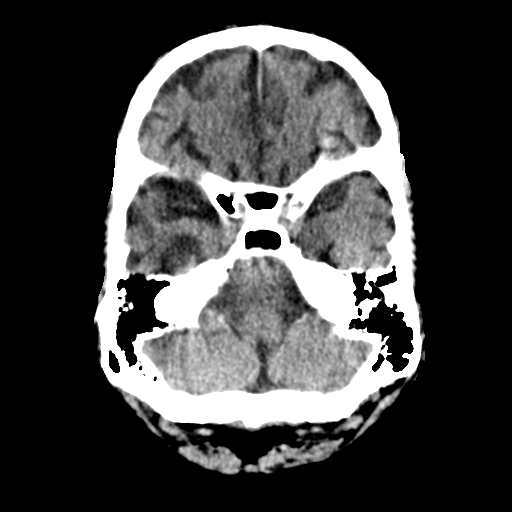
[im 9/28  brain]
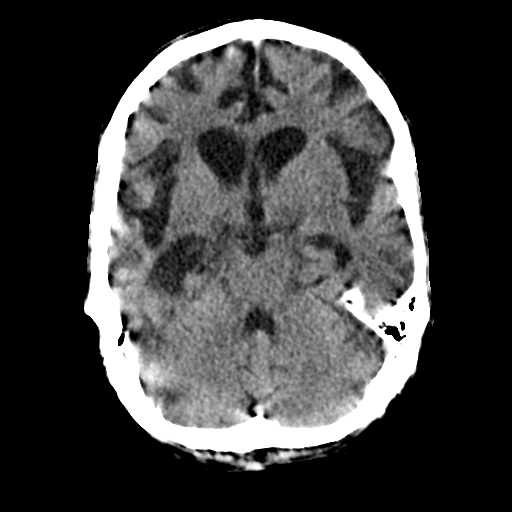
[im 12/28  brain]
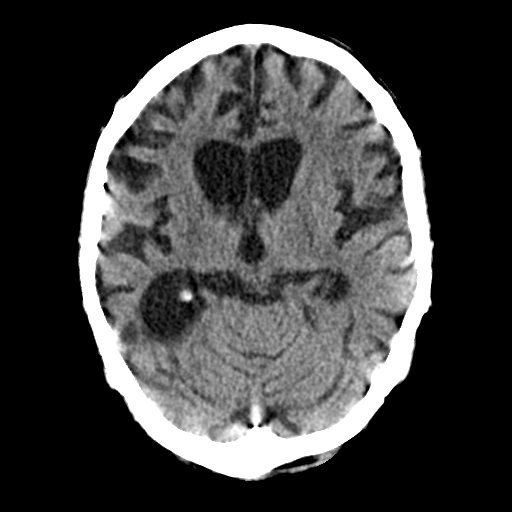
[im 15/28  brain]
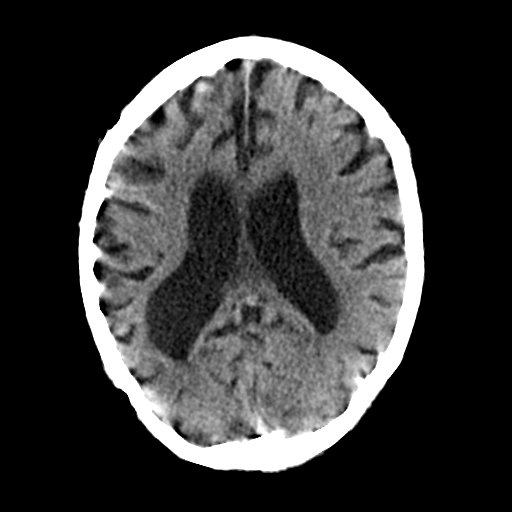
[im 15/28  bone]
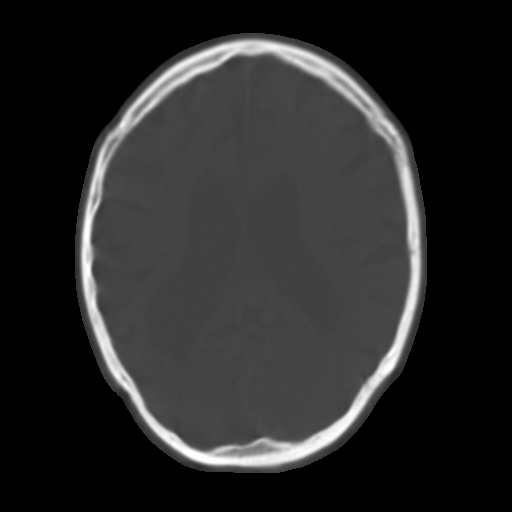
[im 17/28  brain]
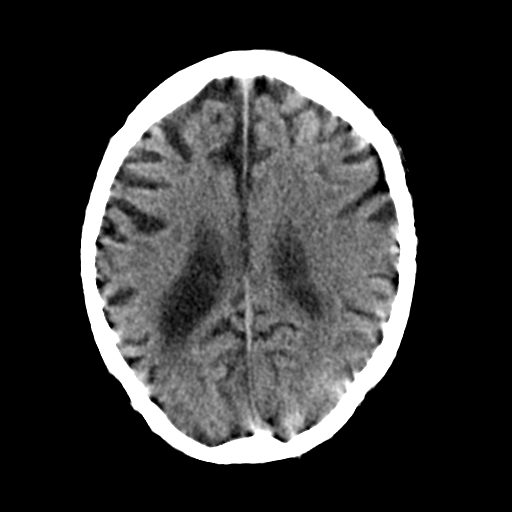
[im 20/28  brain]
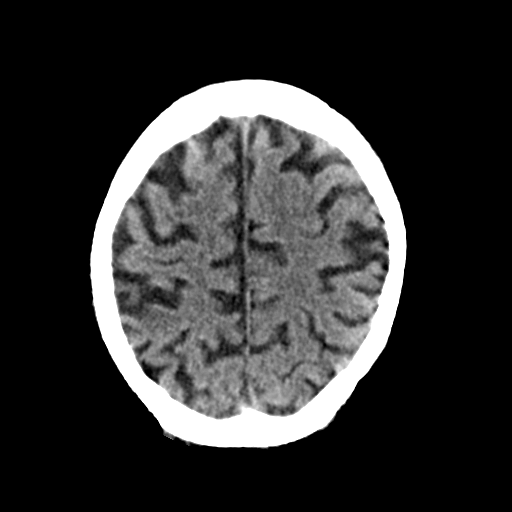
[im 23/28  brain]
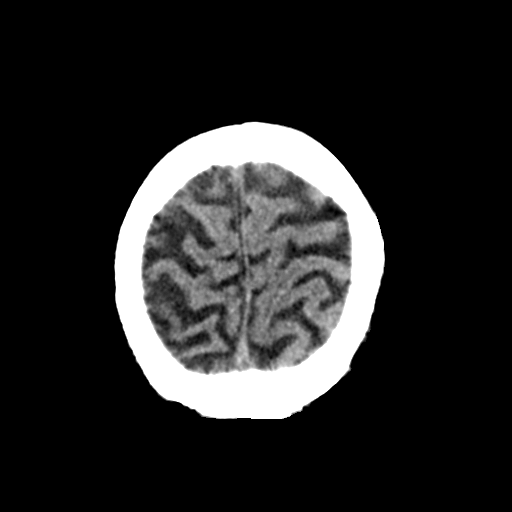
[im 26/28  brain]
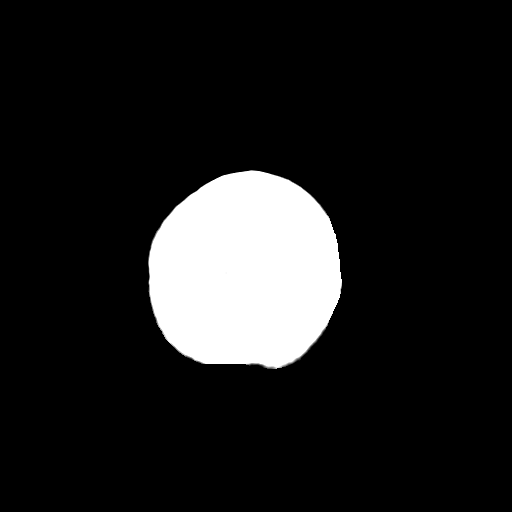
[im 26/28  bone]
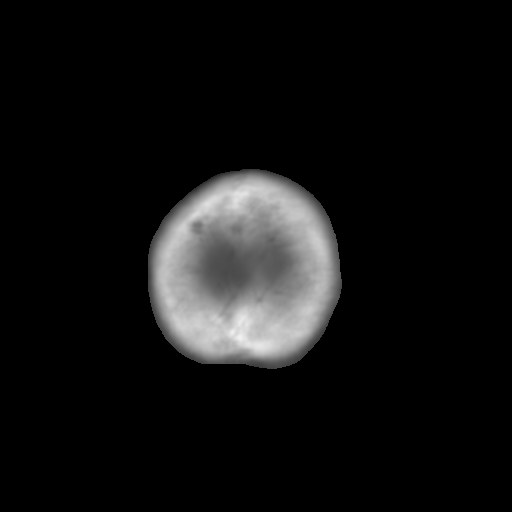

[Series 4: coronal soft tissue · coronal · 0.28mm/px · 3 of 64 slices shown]
[im 22/64  brain]
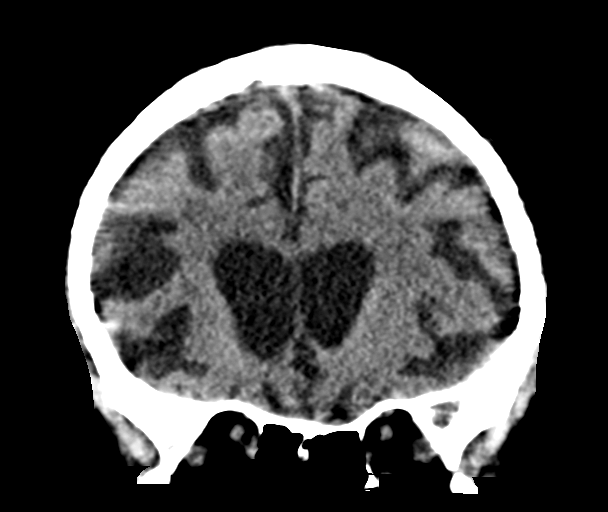
[im 29/64  brain]
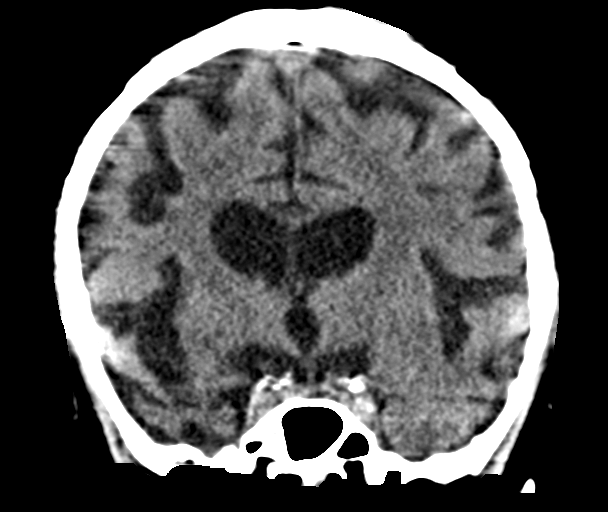
[im 36/64  brain]
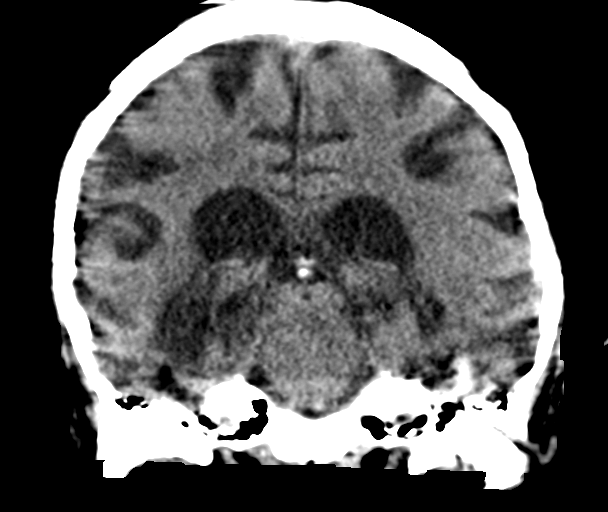

[Series 5: sagittal soft tissue · sagittal · 0.31mm/px · 3 of 51 slices shown]
[im 17/51  brain]
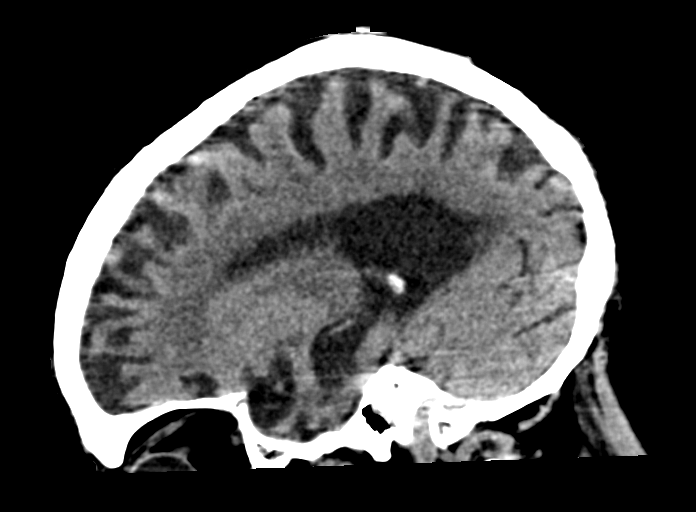
[im 26/51  brain]
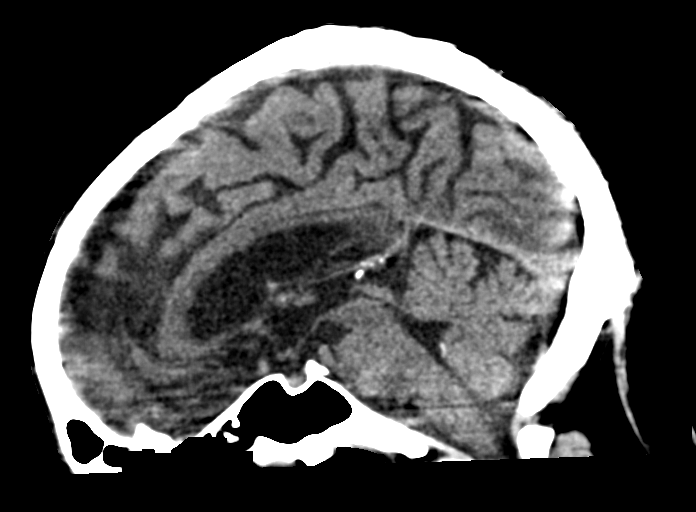
[im 34/51  brain]
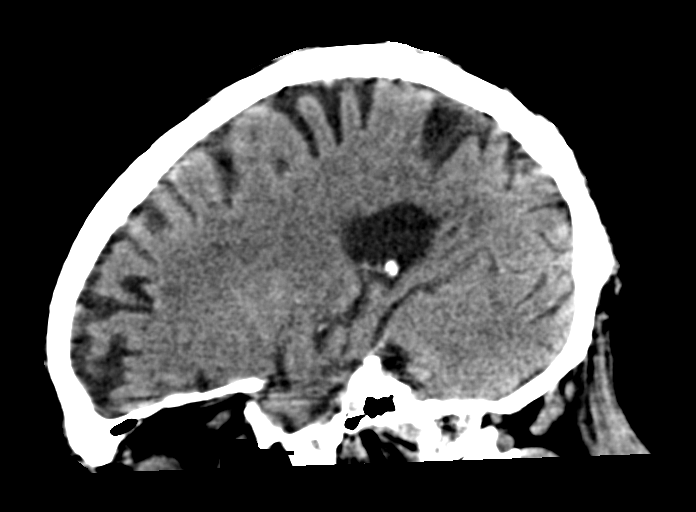

[15 of 45 positions shown; findings below may reference images not displayed]

FINDINGS: Brain: Extensive atrophy and chronic microvascular ischemic change
are identified. No evidence of acute abnormality including
hemorrhage, infarct, mass lesion, mass effect, midline shift or
abnormal extra-axial fluid collection. No hydrocephalus or
pneumocephalus.

Vascular: Unremarkable.

Skull: Intact.

Sinuses/Orbits: Unremarkable.

Other: None.
IMPRESSION: No acute abnormality.

Atrophy and chronic microvascular ischemic change.

## 2018-01-19 DEATH — deceased
